# Patient Record
Sex: Female | Born: 1990
Health system: Southern US, Community
[De-identification: ages and names within clinical notes are randomized; demographics above are authoritative.]

## PROBLEM LIST (undated history)

## (undated) DIAGNOSIS — F329 Major depressive disorder, single episode, unspecified: Secondary | ICD-10-CM

## (undated) DIAGNOSIS — F32A Depression, unspecified: Secondary | ICD-10-CM

## (undated) HISTORY — DX: Depression, unspecified: F32.A

## (undated) HISTORY — PX: TONSILLECTOMY AND ADENOIDECTOMY: SUR1326

---
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
## 2017-04-30 ENCOUNTER — Ambulatory Visit (INDEPENDENT_AMBULATORY_CARE_PROVIDER_SITE_OTHER): Payer: 59 | Admitting: Psychiatry

## 2017-04-30 ENCOUNTER — Encounter (HOSPITAL_COMMUNITY): Payer: Self-pay | Admitting: Psychiatry

## 2017-04-30 VITALS — BP 122/70 | HR 124 | Ht 64.5 in | Wt 137.0 lb

## 2017-04-30 DIAGNOSIS — Z818 Family history of other mental and behavioral disorders: Secondary | ICD-10-CM

## 2017-04-30 DIAGNOSIS — Z915 Personal history of self-harm: Secondary | ICD-10-CM | POA: Diagnosis not present

## 2017-04-30 DIAGNOSIS — F339 Major depressive disorder, recurrent, unspecified: Secondary | ICD-10-CM | POA: Insufficient documentation

## 2017-04-30 DIAGNOSIS — Z810 Family history of intellectual disabilities: Secondary | ICD-10-CM

## 2017-04-30 MED ORDER — SERTRALINE HCL 50 MG PO TABS: ORAL_TABLET | ORAL | 1 refills | Status: DC

## 2017-04-30 NOTE — Patient Instructions (Signed)
1. Start sertraline 25 mg at night for one week, then 50 mg at night  2. Return to clinic in one month for 30 mins [facility] Address: [address], [place], [place] [address]  Phone: [phone]

## 2017-06-22 ENCOUNTER — Telehealth (HOSPITAL_COMMUNITY): Payer: Self-pay | Admitting: Psychiatry

## 2017-06-22 MED ORDER — SERTRALINE HCL 50 MG PO TABS: 50.0000 mg | ORAL_TABLET | Freq: Every day | ORAL | 0 refills | Status: DC

## 2017-06-22 NOTE — Telephone Encounter (Signed)
Received request for sertraline. Ordered for a month. Please contact the patient to make follow up appointment.

## 2017-06-22 NOTE — Telephone Encounter (Signed)
Mailbox is full & unable to accept any new messages @ this time

## 2017-08-11 NOTE — Progress Notes (Signed)
BH MD/PA/NP OP Progress Note  08/16/2017 9:32 AM Jennifer Parker  MRN:  [id number]  Chief Complaint:  Chief Complaint    Follow-up; Anxiety; Depression     HPI:  The patient presents for follow-up appointment for depression. She states that she has been doing very well since the last visit. She noticed that she has not had any crying spells since the last visit.  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 She reports good relationship with her fiancee. She is planning for wedding and things are going well. She still chews her tongue even when she feels relaxed. There are some scars from it. She used to pick her hair, skin growing up. She has fair sleep. She feels fatigue and has low motivation at times. She has good concentration. She denies SI. She had panic attack when she came back from home. She feels tense around her neck and has started doing massage every month.   Wt Readings from Last 3 Encounters:  08/16/17 136 lb (61.7 kg)  04/30/17 137 lb (62.1 kg)    Visit Diagnosis:    ICD-10-CM   1. Major depressive disorder, recurrent episode with seasonal pattern (HCC) F33.9     Past Psychiatric History: Please see initial evaluation for full details. I have reviewed the history. No updates at this time.     Past Medical History: No past medical history on file.  Past Surgical History:  Procedure Laterality Date  . TONSILLECTOMY AND ADENOIDECTOMY      Family Psychiatric History: Please see initial evaluation for full details. I have reviewed the history. No updates at this time.     Family History: No family history on file.  Social History:  Social History   Socioeconomic History  . Marital status: Single    Spouse name: Not on file  . Number of children: Not on file  . Years of  education: Not on file  . Highest education level: Not on file  Occupational History  . Not on file  Social Needs  . Financial resource strain: Not on file  . Food insecurity:    Worry: Not on file    Inability: Not on file  . Transportation needs:    Medical: Not on file    Non-medical: Not on file  Tobacco Use  . Smoking status: Never Smoker  . Smokeless tobacco: Never Used  Substance and Sexual Activity  . Alcohol use: Yes    Alcohol/week: 1.2 - 1.8 oz    Types: 2 - 3 Cans of beer per week  . Drug use: No  . Sexual activity: Yes    Birth control/protection: Condom  Lifestyle  . Physical activity:    Days per week: Not on file    Minutes per session: Not on file  . Stress: Not on file  Relationships  . Social connections:    Talks on phone: Not on file    Gets together: Not on file    Attends religious service: Not on file    Active member of club or organization: Not on file    Attends meetings of clubs or organizations: Not on file    Relationship status: Not on file  Other Topics Concern  . Not on file  Social History Narrative  . Not  on file    Allergies: No Known Allergies  Metabolic Disorder Labs: No results found for: HGBA1C, MPG No results found for: PROLACTIN No results found for: CHOL, TRIG, HDL, CHOLHDL, VLDL, LDLCALC No results found for: TSH  Therapeutic Level Labs: No results found for: LITHIUM No results found for: VALPROATE No components found for:  CBMZ  Current Medications: Current Outpatient Medications  Medication Sig Dispense Refill  . aspirin-acetaminophen-caffeine (EXCEDRIN MIGRAINE) 250-250-65 MG tablet Take by mouth as needed for headache.    . Cannabidiol 100 MG/ML SOLN Take by mouth.    . loratadine (CLARITIN) 10 MG tablet Take 10 mg by mouth daily.    . sertraline (ZOLOFT) 100 MG tablet Take 1 tablet (100 mg total) by mouth daily. 90 tablet 0   No current facility-administered medications for this visit.       Musculoskeletal: Strength & Muscle Tone: within normal limits Gait & Station: normal Patient leans: N/A  Psychiatric Specialty Exam: Review of Systems  Psychiatric/Behavioral: Negative for depression, hallucinations, memory loss, substance abuse and suicidal ideas. The patient is nervous/anxious. The patient does not have insomnia.   All other systems reviewed and are negative.   Blood pressure 114/80, pulse (!) 103, height 5' 4.5" (1.638 m), weight 136 lb (61.7 kg), SpO2 100 %.Body mass index is 22.98 kg/m.  General Appearance: Fairly Groomed  Eye Contact:  Good  Speech:  Clear and Coherent  Volume:  Normal  Mood:  "better"  Affect:  Appropriate and Congruent  Thought Process:  Coherent  Orientation:  Full (Time, Place, and Person)  Thought Content: Logical   Suicidal Thoughts:  No  Homicidal Thoughts:  No  Memory:  Immediate;   Good  Judgement:  Good  Insight:  Good  Psychomotor Activity:  Normal  Concentration:  Concentration: Good and Attention Span: Good  Recall:  Good  Fund of Knowledge: Good  Language: Good  Akathisia:  No  Handed:  Right  AIMS (if indicated): not done  Assets:  Communication Skills Desire for Improvement  ADL's:  Intact  Cognition: WNL  Sleep:  Good   Screenings:   Assessment and Plan:  Jennifer Parker is a 27 y.o. year old female with a history of anxiety, who presents for follow up appointment for Major depressive disorder, recurrent episode with seasonal pattern (HCC)  # MDD, moderate with seasonal pattern Patient reports significant improvement in neurovegetative symptoms and irritability.  Will uptitrate sertraline to target neurovegetative symptoms.  Discussed potential GI side effect, sexual dysfunction and increased SI in younger population.   # r/o OCD Patient reports constant chewing of her tongue. Will see if uptitration of sertraline helps her compulsion like symptoms. Also discussed "chew time' for behavioral  modification.   Plan 1. Increase sertraline 100 mg daily  2. Return to clinic in three months for 15 mins  3. Check TSH (thyroid function) with your primary care doctor  The patient demonstrates the following risk factors for suicide: Chronic risk factors for suicide include: psychiatric disorder of depression. Acute risk factors for suicide include: N/A. Protective factors for this patient include: responsibility to others (children, family), coping skills and hope for the future. Considering these factors, the overall suicide risk at this point appears to be low. Patient is appropriate for outpatient follow up.  The duration of this appointment visit was 30 minutes of face-to-face time with the patient.  Greater than 50% of this time was spent in counseling, explanation of  diagnosis, planning of further management, and  coordination of care.  Neysa Hottereina Quency Tober, MD 08/16/2017, 9:32 AM

## 2017-08-16 ENCOUNTER — Ambulatory Visit (INDEPENDENT_AMBULATORY_CARE_PROVIDER_SITE_OTHER): Payer: 59 | Admitting: Psychiatry

## 2017-08-16 ENCOUNTER — Encounter (HOSPITAL_COMMUNITY): Payer: Self-pay | Admitting: Psychiatry

## 2017-08-16 VITALS — BP 114/80 | HR 103 | Ht 64.5 in | Wt 136.0 lb

## 2017-08-16 DIAGNOSIS — F339 Major depressive disorder, recurrent, unspecified: Secondary | ICD-10-CM

## 2017-08-16 MED ORDER — SERTRALINE HCL 100 MG PO TABS: 100.0000 mg | ORAL_TABLET | Freq: Every day | ORAL | 0 refills | Status: DC

## 2017-08-16 NOTE — Patient Instructions (Signed)
1. Increase sertraline 100 mg daily  2. Return to clinic in three months for 15 mins  3. Check TSH (thryoid function) with your primary care doctor

## 2017-11-07 ENCOUNTER — Other Ambulatory Visit (HOSPITAL_COMMUNITY): Payer: Self-pay | Admitting: Psychiatry

## 2017-11-07 MED ORDER — SERTRALINE HCL 100 MG PO TABS: 100.0000 mg | ORAL_TABLET | Freq: Every day | ORAL | 0 refills | Status: DC

## 2017-11-10 NOTE — Progress Notes (Signed)
BH MD/PA/NP OP Progress Note  11/16/2017 8:25 AM Jennifer Parker  MRN:  [id number]  Chief Complaint:  Chief Complaint    Follow-up; Depression     HPI:  Patient presents for follow-up appointment for depression.  She states that she has not noticed significant change in her mood.  She becomes irritable and menstrual cycle, although she has been able to handle it.  Her sister will move out.  She reports great relationship with her fianc and has good communication with each other. She enjoys her job. She is concerned about her weight gain, although she has been active as before.  She denies insomnia.  She reports occasional fatigue.  She denies feeling depressed.  She has good concentration.  She denies SI.  She denies anxiety or panic attacks.    Wt Readings from Last 3 Encounters:  11/16/17 147 lb (66.7 kg)  08/16/17 136 lb (61.7 kg)  04/30/17 137 lb (62.1 kg)    Visit Diagnosis:    ICD-10-CM   1. Major depressive disorder, recurrent episode with seasonal pattern (HCC) F33.9     Past Psychiatric History: Please see initial evaluation for full details. I have reviewed the history. No updates at this time.     Past Medical History: No past medical history on file.  Past Surgical History:  Procedure Laterality Date  . TONSILLECTOMY AND ADENOIDECTOMY      Family Psychiatric History: Please see initial evaluation for full details. I have reviewed the history. No updates at this time.     Family History: No family history on file.  Social History:  Social History   Socioeconomic History  . Marital status: Single    Spouse name: Not on file  . Number of children: Not on file  . Years of education: Not on file  . Highest education level: Not on file  Occupational History  . Not on file  Social Needs  . Financial resource strain: Not on file  . Food insecurity:    Worry: Not on file    Inability: Not on file  . Transportation needs:    Medical: Not on file   Non-medical: Not on file  Tobacco Use  . Smoking status: Never Smoker  . Smokeless tobacco: Never Used  Substance and Sexual Activity  . Alcohol use: Yes    Alcohol/week: 2.0 - 3.0 standard drinks    Types: 2 - 3 Cans of beer per week  . Drug use: No  . Sexual activity: Yes    Birth control/protection: Condom  Lifestyle  . Physical activity:    Days per week: Not on file    Minutes per session: Not on file  . Stress: Not on file  Relationships  . Social connections:    Talks on phone: Not on file    Gets together: Not on file    Attends religious service: Not on file    Active member of club or organization: Not on file    Attends meetings of clubs or organizations: Not on file    Relationship status: Not on file  Other Topics Concern  . Not on file  Social History Narrative  . Not on file    Allergies: No Known Allergies  Metabolic Disorder Labs: No results found for: HGBA1C, MPG No results found for: PROLACTIN No results found for: CHOL, TRIG, HDL, CHOLHDL, VLDL, LDLCALC No results found for: TSH  Therapeutic Level Labs: No results found for: LITHIUM No results found for: VALPROATE No components found  for:  CBMZ  Current Medications: Current Outpatient Medications  Medication Sig Dispense Refill  . aspirin-acetaminophen-caffeine (EXCEDRIN MIGRAINE) 250-250-65 MG tablet Take by mouth as needed for headache.    . Cannabidiol 100 MG/ML SOLN Take by mouth.    . loratadine (CLARITIN) 10 MG tablet Take 10 mg by mouth daily.    . sertraline (ZOLOFT) 100 MG tablet Take 1 tablet (100 mg total) by mouth daily. 90 tablet 0   No current facility-administered medications for this visit.      Musculoskeletal: Strength & Muscle Tone: within normal limits Gait & Station: normal Patient leans: N/A  Psychiatric Specialty Exam: Review of Systems  Psychiatric/Behavioral: Negative for depression, hallucinations, memory loss, substance abuse and suicidal ideas. The patient  is not nervous/anxious and does not have insomnia.   All other systems reviewed and are negative.   Blood pressure 111/75, pulse 79, height 5' 4.5" (1.638 m), weight 147 lb (66.7 kg), SpO2 98 %.Body mass index is 24.84 kg/m.  General Appearance: Fairly Groomed  Eye Contact:  Good  Speech:  Clear and Coherent  Volume:  Normal  Mood:  "good"  Affect:  Appropriate, Congruent and Full Range  Thought Process:  Coherent  Orientation:  Full (Time, Place, and Person)  Thought Content: Logical   Suicidal Thoughts:  No  Homicidal Thoughts:  No  Memory:  Immediate;   Good  Judgement:  Good  Insight:  Good  Psychomotor Activity:  Normal  Concentration:  Concentration: Good and Attention Span: Good  Recall:  Good  Fund of Knowledge: Good  Language: Good  Akathisia:  No  Handed:  Right  AIMS (if indicated): not done  Assets:  Communication Skills Desire for Improvement  ADL's:  Intact  Cognition: WNL  Sleep:  Good   Screenings:   Assessment and Plan:  Xitlali Kastens is a 27 y.o. year old female with a history of anxiety , who presents for follow up appointment for Major depressive disorder, recurrent episode with seasonal pattern (HCC)  # MDD, moderate with seasonal pattern Although patient denies significant change in her mood symptoms, she has been steadily active and denies significant mood symptoms.  Will continue sertraline to target depression.  Noted that she has had weight gain; will consider tapering down if she continues to have weight gain.  Discussed potential GI side effect, sexual dysfunction and increase in SI in younger population.   # r/o OCD There has been improvement in chewing of her tongue as her mood improves.  Will continue to monitor.   Plan 1. Continue sertraline 100 mg daily  2. Return to clinic in three months for 15 mins 3. Check TSH (thyroid function) with your primary care doctor  The patient demonstrates the following risk factors for suicide:  Chronic risk factors for suicide include:psychiatric disorder ofdepression. Acute risk factorsfor suicide include: N/A. Protective factorsfor this patient include: responsibility to others (children, family), coping skills and hope for the future. Considering these factors, the overall suicide risk at this point appears to below. Patientisappropriate for outpatient follow up.  Neysa Hotter, MD 11/16/2017, 8:25 AM

## 2017-11-16 ENCOUNTER — Encounter (HOSPITAL_COMMUNITY): Payer: Self-pay | Admitting: Psychiatry

## 2017-11-16 ENCOUNTER — Ambulatory Visit (INDEPENDENT_AMBULATORY_CARE_PROVIDER_SITE_OTHER): Payer: 59 | Admitting: Psychiatry

## 2017-11-16 VITALS — BP 111/75 | HR 79 | Ht 64.5 in | Wt 147.0 lb

## 2017-11-16 DIAGNOSIS — F339 Major depressive disorder, recurrent, unspecified: Secondary | ICD-10-CM

## 2017-11-16 MED ORDER — SERTRALINE HCL 100 MG PO TABS
100.0000 mg | ORAL_TABLET | Freq: Every day | ORAL | 0 refills | Status: DC
Start: 2017-11-16 — End: 2018-02-22

## 2017-11-16 NOTE — Patient Instructions (Signed)
1. Continue sertraline 100 mg daily  2. Return to clinic in three months for 15 mins 

## 2018-02-21 ENCOUNTER — Ambulatory Visit (HOSPITAL_COMMUNITY): Payer: 59 | Admitting: Psychiatry

## 2018-02-22 ENCOUNTER — Other Ambulatory Visit (HOSPITAL_COMMUNITY): Payer: Self-pay | Admitting: Psychiatry

## 2018-02-22 MED ORDER — SERTRALINE HCL 100 MG PO TABS: 100.0000 mg | ORAL_TABLET | Freq: Every day | ORAL | 0 refills | Status: DC

## 2018-05-30 ENCOUNTER — Other Ambulatory Visit: Payer: Self-pay

## 2018-05-30 ENCOUNTER — Encounter (HOSPITAL_COMMUNITY): Payer: Self-pay | Admitting: Psychiatry

## 2018-05-30 ENCOUNTER — Ambulatory Visit (INDEPENDENT_AMBULATORY_CARE_PROVIDER_SITE_OTHER): Payer: 59 | Admitting: Psychiatry

## 2018-05-30 DIAGNOSIS — F339 Major depressive disorder, recurrent, unspecified: Secondary | ICD-10-CM | POA: Diagnosis not present

## 2018-05-30 MED ORDER — SERTRALINE HCL 100 MG PO TABS: 100.0000 mg | ORAL_TABLET | Freq: Every day | ORAL | 0 refills | Status: DC

## 2018-05-30 NOTE — Progress Notes (Signed)
Virtual Visit via Video Note  I connected with Jennifer Parker on 05/30/18 at  9:15 AM EDT by a video enabled telemedicine application and verified that I am speaking with the correct person using two identifiers.   I discussed the limitations of evaluation and management by telemedicine and the availability of in person appointments. The patient expressed understanding and agreed to proceed.    I discussed the assessment and treatment plan with the patient. The patient was provided an opportunity to ask questions and all were answered. The patient agreed with the plan and demonstrated an understanding of the instructions.   The patient was advised to call back or seek an in-person evaluation if the symptoms worsen or if the condition fails to improve as anticipated.  I provided 25 minutes of non-face-to-face time during this encounter.   Neysa Hotter, MD    [facility] MD/PA/NP OP Progress Note  05/30/2018 9:48 AM Jennifer Parker  MRN:  [id number]  Chief Complaint:  Chief Complaint    Follow-up; Depression     HPI:  This is a follow-up visit for depression.  She states that she wishes to go back on higher dose of sertraline.  She has been on 50 mg for the past few months as she was concerned about weight gain.  She did not notice any difference in her weight even after she decreased the dose.  She notices that she has been feeling more anxious and irritable especially at her fianc.  She also reports that she had false accusation at work.  She had move her workplace from [employer] without good notice to her clients.  It was very hard time for the patient as she also had to reduce the visit, and it affected her income.  She is concerned about difference in way of communication in [place] compared to other states.  She is concerned that she may be more direct compared to  people here.  She and her fianc discussed about the situation, and they may end up moving in an year,  although they will stay at least for an year.  She has fair sleep.  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  She has fair concentration.  She denies SI.  She feels anxious and tense at times.  She had a few panic attacks when she received a false accusation.    Visit Diagnosis:    ICD-10-CM   1. Major depressive disorder, recurrent episode with seasonal pattern (HCC) F33.9     Past Psychiatric History: Please see initial evaluation for full details. I have reviewed the history. No updates at this time.     Past Medical History: No past medical history on file.  Past Surgical History:  Procedure Laterality Date  . TONSILLECTOMY AND ADENOIDECTOMY      Family Psychiatric History: Please see initial evaluation for full details. I have reviewed the history. No updates at this time.     Family History: No family history on file.  Social History:  Social History   Socioeconomic History  . Marital status: Single    Spouse name: Not on file  . Number of children: Not on file  . Years of education: Not on file  . Highest education level: Not on file  Occupational History  . Not on file  Social Needs  . Financial resource strain: Not on file  .  Food insecurity:    Worry: Not on file    Inability: Not on file  . Transportation needs:    Medical: Not on file    Non-medical: Not on file  Tobacco Use  . Smoking status: Never Smoker  . Smokeless tobacco: Never Used  Substance and Sexual Activity  . Alcohol use: Yes    Alcohol/week: 2.0 - 3.0 standard drinks    Types: 2 - 3 Cans of beer per week  . Drug use: No  . Sexual activity: Yes    Birth control/protection: Condom  Lifestyle  . Physical activity:    Days per week: Not on file    Minutes per session: Not on file  . Stress: Not on file  Relationships  . Social connections:    Talks on  phone: Not on file    Gets together: Not on file    Attends religious service: Not on file    Active member of club or organization: Not on file    Attends meetings of clubs or organizations: Not on file    Relationship status: Not on file  Other Topics Concern  . Not on file  Social History Narrative  . Not on file    Allergies: No Known Allergies  Metabolic Disorder Labs: No results found for: HGBA1C, MPG No results found for: PROLACTIN No results found for: CHOL, TRIG, HDL, CHOLHDL, VLDL, LDLCALC No results found for: TSH  Therapeutic Level Labs: No results found for: LITHIUM No results found for: VALPROATE No components found for:  CBMZ  Current Medications: Current Outpatient Medications  Medication Sig Dispense Refill  . aspirin-acetaminophen-caffeine (EXCEDRIN MIGRAINE) 250-250-65 MG tablet Take by mouth as needed for headache.    . Cannabidiol 100 MG/ML SOLN Take by mouth.    . loratadine (CLARITIN) 10 MG tablet Take 10 mg by mouth daily.    . sertraline (ZOLOFT) 100 MG tablet Take 1 tablet (100 mg total) by mouth daily. 90 tablet 0   No current facility-administered medications for this visit.      Musculoskeletal: Strength & Muscle Tone: N/A Gait & Station: N/A Patient leans: N/A  Psychiatric Specialty Exam: Review of Systems  Psychiatric/Behavioral: Positive for depression. Negative for hallucinations, memory loss, substance abuse and suicidal ideas. The patient is nervous/anxious and has insomnia.   All other systems reviewed and are negative.   There were no vitals taken for this visit.There is no height or weight on file to calculate BMI.  General Appearance: Fairly Groomed  Eye Contact:  Good  Speech:  Clear and Coherent  Volume:  Normal  Mood:  Anxious and Depressed  Affect:  Appropriate, Congruent, Tearful and reactive  Thought Process:  Coherent and Goal Directed  Orientation:  Full (Time, Place, and Person)  Thought Content: Logical    Suicidal Thoughts:  No  Homicidal Thoughts:  No  Memory:  Immediate;   Good  Judgement:  Good  Insight:  Good  Psychomotor Activity:  Normal  Concentration:  Concentration: Good and Attention Span: Good  Recall:  Good  Fund of Knowledge: Good  Language: Good  Akathisia:  No  Handed:  Right  AIMS (if indicated): not done  Assets:  Communication Skills Desire for Improvement  ADL's:  Intact  Cognition: WNL  Sleep:  Fair   Screenings:   Assessment and Plan:  Jennifer GashKatharyn Patterson is a 28 y.o. year old female with a history of anxiety, who presents for follow up appointment for Major depressive disorder, recurrent episode with  seasonal pattern (HCC)  # MDD, moderate with seasonal pattern Patient reports worsening in depressive symptoms after she tapered down sertraline.  There was also recent psychosocial stressors of being use of false accusation at work.  Will uptitrate sertraline to original dose to target depression.  Noted that although she was given choice of trying other antidepressant given her concern of weight gain, she prefers to stay on this medication.  Discussed potential GI side effect, sexual dysfunction and increasing SI in younger population.   Plan I have reviewed and updated plans as below 1. Continue sertraline 100 mg daily  2. Return to clinic in three months, transfer to [place] office due to change in her workplace 3. Check TSH (thyroid function) with your primary care doctor  The patient demonstrates the following risk factors for suicide: Chronic risk factors for suicide include:psychiatric disorder ofdepression. Acute risk factorsfor suicide include: N/A. Protective factorsfor this patient include: responsibility to others (children, family), coping skills and hope for the future. Considering these factors, the overall suicide risk at this point appears to below. Patientisappropriate for outpatient follow up.   Neysa Hotter, MD 05/30/2018, 9:48  AM

## 2018-05-30 NOTE — Patient Instructions (Signed)
1. Continue sertraline 100 mg daily  2. Return to clinic in three months, transfer to [facility] office

## 2018-08-21 ENCOUNTER — Other Ambulatory Visit (HOSPITAL_COMMUNITY): Payer: Self-pay | Admitting: Psychiatry

## 2018-08-21 MED ORDER — SERTRALINE HCL 100 MG PO TABS: 100.0000 mg | ORAL_TABLET | Freq: Every day | ORAL | 0 refills | Status: DC

## 2018-08-30 ENCOUNTER — Ambulatory Visit (HOSPITAL_COMMUNITY): Payer: 59 | Admitting: Psychiatry

## 2018-08-30 NOTE — Progress Notes (Signed)
Virtual Visit via Video Note  I connected with Jennifer Parker on 09/06/18 at  4:00 PM EDT by a video enabled telemedicine application and verified that I am speaking with the correct person using two identifiers.   I discussed the limitations of evaluation and management by telemedicine and the availability of in person appointments. The patient expressed understanding and agreed to proceed.    I discussed the assessment and treatment plan with the patient. The patient was provided an opportunity to ask questions and all were answered. The patient agreed with the plan and demonstrated an understanding of the instructions.   The patient was advised to call back or seek an in-person evaluation if the symptoms worsen or if the condition fails to improve as anticipated.  I provided 25 minutes of non-face-to-face time during this encounter.   Neysa Hottereina Mckenzee Beem, MD   [facility] MD/[facility] OP Progress Note  09/06/2018 4:52 PM Jennifer Parker  MRN:  [id number]  Chief Complaint:  Chief Complaint    Anxiety; Depression     HPI:  This is a follow-up appointment for depression.  She states that she has been doing well.  She got married since the last visit.  The wedding went well, although she was very nervous preparing for it.  Although she has not gotten back to full-time yet, she has been developing full case load. 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 She states that she had an episode of sleeping 2 hours of sleep each night, feeling energized, having many ideas about online project for clients, which lasted for three nights in April. Her sister was also concerned of this episode. She had another episode of decreased need for sleep for a day, a few weeks ago. She is concerned about this as this is very unusual, stating that she always sleeps well. She states that both episode occurred when she watches news of recent protest. She wonders whether this is secondary to  bipolar disorder, referring to her mother, who may have bipolar disorder (not formally diagnosed). She sleeps well on other days. She denies feeling depressed.  She has good motivation and energy.  She has good concentration.  She denies SI.  She feels less anxious.  She denies panic attacks.  She denies any increased goal-directed behavior.  She denies irritability. She drinks a few mixed drinks, a few times per week. She denies drug use.   Her sister presents for video visit for collaterals with patient consent. Glenda ChromanKatharyn was not sleeping well for a few days, and was unable to focus while she tries to do many thing. There was a time she bought items in shopping, which they already recently bought. Her sister is not aware of any similar episodes before this time.    Visit Diagnosis:    ICD-10-CM   1. Major depressive disorder, recurrent episode with seasonal pattern (HCC)  F33.9     Past Psychiatric History: Please see initial evaluation for full details. I have reviewed the history. No updates at this time.     Past Medical History: No past medical history on file.  Past Surgical History:  Procedure Laterality Date  . TONSILLECTOMY AND ADENOIDECTOMY      Family Psychiatric History: Please see initial evaluation for full details. I have reviewed the history. No updates at this time.     Family History:  Family History  Problem Relation Age of Onset  . Alcohol abuse Mother   . Depression Mother   .  Depression Sister   . Anxiety disorder Sister   . Depression Maternal Grandmother     Social History:  Social History   Socioeconomic History  . Marital status: Single    Spouse name: Not on file  . Number of children: Not on file  . Years of education: Not on file  . Highest education level: Not on file  Occupational History  . Not on file  Social Needs  . Financial resource strain: Not on file  . Food insecurity    Worry: Not on file    Inability: Not on file  .  Transportation needs    Medical: Not on file    Non-medical: Not on file  Tobacco Use  . Smoking status: Never Smoker  . Smokeless tobacco: Never Used  Substance and Sexual Activity  . Alcohol use: Yes    Alcohol/week: 2.0 - 3.0 standard drinks    Types: 2 - 3 Cans of beer per week  . Drug use: No  . Sexual activity: Yes    Birth control/protection: Condom  Lifestyle  . Physical activity    Days per week: Not on file    Minutes per session: Not on file  . Stress: Not on file  Relationships  . Social Musicianconnections    Talks on phone: Not on file    Gets together: Not on file    Attends religious service: Not on file    Active member of club or organization: Not on file    Attends meetings of clubs or organizations: Not on file    Relationship status: Not on file  Other Topics Concern  . Not on file  Social History Narrative  . Not on file    Allergies: No Known Allergies  Metabolic Disorder Labs: No results found for: HGBA1C, MPG No results found for: PROLACTIN No results found for: CHOL, TRIG, HDL, CHOLHDL, VLDL, LDLCALC No results found for: TSH  Therapeutic Level Labs: No results found for: LITHIUM No results found for: VALPROATE No components found for:  CBMZ  Current Medications: Current Outpatient Medications  Medication Sig Dispense Refill  . aspirin-acetaminophen-caffeine (EXCEDRIN MIGRAINE) 250-250-65 MG tablet Take by mouth as needed for headache.    . Cannabidiol 100 MG/ML SOLN Take by mouth.    . loratadine (CLARITIN) 10 MG tablet Take 10 mg by mouth daily.    . sertraline (ZOLOFT) 100 MG tablet Take 1 tablet (100 mg total) by mouth daily. 90 tablet 0   No current facility-administered medications for this visit.      Musculoskeletal: Strength & Muscle Tone: N/A Gait & Station: N/A Patient leans: N/A  Psychiatric Specialty Exam: Review of Systems  Psychiatric/Behavioral: Negative for depression, hallucinations, memory loss, substance abuse and  suicidal ideas. The patient is nervous/anxious. The patient does not have insomnia.   All other systems reviewed and are negative.   There were no vitals taken for this visit.There is no height or weight on file to calculate BMI.  General Appearance: Fairly Groomed  Eye Contact:  Good  Speech:  Clear and Coherent  Volume:  Normal  Mood:  "good"  Affect:  Appropriate, Congruent and Full Range  Thought Process:  Coherent  Orientation:  Full (Time, Place, and Person)  Thought Content: Logical   Suicidal Thoughts:  No  Homicidal Thoughts:  No  Memory:  Immediate;   Good  Judgement:  Good  Insight:  Good  Psychomotor Activity:  Normal  Concentration:  Concentration: Good and Attention Span: Good  Recall:  Good  Fund of Knowledge: Good  Language: Good  Akathisia:  No  Handed:  Right  AIMS (if indicated): not done  Assets:  Communication Skills Desire for Improvement  ADL's:  Intact  Cognition: WNL  Sleep:  Good   Screenings:   Assessment and Plan:  Daana Petrasek is a 28 y.o. year old female with a history of anxiety, who presents for follow up appointment for depression.    # MDD, mild with seasonal pattern R/o mixed features There has been overall improvement in depressive symptoms since her last visit.  Psychosocial stressors includes also accusation at work. Although the patient and her sister reports subthreshold hypomanic symptoms of a few days of decreased need for sleep with euphoria, it is difficult to discern whether it is related to any underling bipolar disorder. Provided psycho education of bipolar disorder. Will continue sertraline at this time for depression.   Plan I have reviewed and updated plans as below 1. Continue sertraline 100 mg daily  2. Next appointment: 9/15 at 9:40 for 20 mins, video 3.Check TSH (thyroid function) with your primary care doctor  The patient demonstrates the following risk factors for suicide: Chronic risk factors for  suicide include:psychiatric disorder ofdepression. Acute risk factorsfor suicide include: N/A. Protective factorsfor this patient include: responsibility to others (children, family), coping skills and hope for the future. Considering these factors, the overall suicide risk at this point appears to below. Patientisappropriate for outpatient follow up.  The duration of this appointment visit was 25 minutes of face-to-face time with the patient.  Greater than 50% of this time was spent in counseling, explanation of  diagnosis, planning of further management, and coordination of care.  Norman Clay, MD 09/06/2018, 4:52 PM

## 2018-09-06 ENCOUNTER — Other Ambulatory Visit: Payer: Self-pay

## 2018-09-06 ENCOUNTER — Encounter (HOSPITAL_COMMUNITY): Payer: Self-pay | Admitting: Psychiatry

## 2018-09-06 ENCOUNTER — Ambulatory Visit (INDEPENDENT_AMBULATORY_CARE_PROVIDER_SITE_OTHER): Payer: 59 | Admitting: Psychiatry

## 2018-09-06 DIAGNOSIS — F339 Major depressive disorder, recurrent, unspecified: Secondary | ICD-10-CM

## 2018-09-06 NOTE — Patient Instructions (Signed)
1. Continue sertraline 100 mg daily  2. Next appointment: 9/15 at 9:40

## 2018-10-27 NOTE — Progress Notes (Signed)
Virtual Visit via Video Note  I connected with Jennifer Parker on 10/31/18 at  9:40 AM EDT by a video enabled telemedicine application and verified that I am speaking with the correct person using two identifiers.   I discussed the limitations of evaluation and management by telemedicine and the availability of in person appointments. The patient expressed understanding and agreed to proceed.     I discussed the assessment and treatment plan with the patient. The patient was provided an opportunity to ask questions and all were answered. The patient agreed with the plan and demonstrated an understanding of the instructions.   The patient was advised to call back or seek an in-person evaluation if the symptoms worsen or if the condition fails to improve as anticipated.  I provided 15 minutes of non-face-to-face time during this encounter.   Neysa Hotter, MD    [facility] MD/PA/NP OP Progress Note  10/31/2018 10:06 AM Jennifer Parker  MRN:  [id number]  Chief Complaint:  Chief Complaint    Anxiety; Depression; Follow-up     HPI:  This is a follow-up appointment for depression.  She states that there was "manic" episode a few days ago. She could not find things in a clutter, and she ended up cleaning the entire house at night. She cried after her husband sheared his concern that how she was "geared up." It lasted for about 45 mins. She notices that she tends to feel dragged when she misses to take sertraline.  Although she feels lonely at times as she is unable to meet with her husband due to his work, she enjoys time with him when they are together ("safety blanket.") She feels anxious, tense at times as there are clutters in the house after they had weeding, relocation and her sister moved in. She had a panic attack when she had negative feedback from her supervisor in front of other co-workers.  Although she believes she has been gaining weight since pandemic, the major is broken and she  is unsure of her weight.  She agreed to have annual physical.  She sleeps well except there were a few days she slept only for a few hours with mild decreased need for sleep.  She has fair concentration.  She denies anhedonia.  She denies SI.  She denies irritability.  She denies impulsive behavior.    Visit Diagnosis:    ICD-10-CM   1. Major depressive disorder, recurrent episode with seasonal pattern (HCC)  F33.9     Past Psychiatric History: Please see initial evaluation for full details. I have reviewed the history. No updates at this time.     Past Medical History: No past medical history on file.  Past Surgical History:  Procedure Laterality Date  . TONSILLECTOMY AND ADENOIDECTOMY      Family Psychiatric History: Please see initial evaluation for full details. I have reviewed the history. No updates at this time.     Family History:  Family History  Problem Relation Age of Onset  . Alcohol abuse Mother   . Depression Mother   . Depression Sister   . Anxiety disorder Sister   . Depression Maternal Grandmother     Social History:  Social History   Socioeconomic History  . Marital status: Single    Spouse name: Not on file  . Number of children: Not on file  . Years of education: Not on file  . Highest education level: Not on file  Occupational History  . Not on file  Social Needs  . Financial resource strain: Not on file  . Food insecurity    Worry: Not on file    Inability: Not on file  . Transportation needs    Medical: Not on file    Non-medical: Not on file  Tobacco Use  . Smoking status: Never Smoker  . Smokeless tobacco: Never Used  Substance and Sexual Activity  . Alcohol use: Yes    Alcohol/week: 2.0 - 3.0 standard drinks    Types: 2 - 3 Cans of beer per week  . Drug use: No  . Sexual activity: Yes    Birth control/protection: Condom  Lifestyle  . Physical activity    Days per week: Not on file    Minutes per session: Not on file  . Stress:  Not on file  Relationships  . Social Herbalist on phone: Not on file    Gets together: Not on file    Attends religious service: Not on file    Active member of club or organization: Not on file    Attends meetings of clubs or organizations: Not on file    Relationship status: Not on file  Other Topics Concern  . Not on file  Social History Narrative  . Not on file    Allergies: No Known Allergies  Metabolic Disorder Labs: No results found for: HGBA1C, MPG No results found for: PROLACTIN No results found for: CHOL, TRIG, HDL, CHOLHDL, VLDL, LDLCALC No results found for: TSH  Therapeutic Level Labs: No results found for: LITHIUM No results found for: VALPROATE No components found for:  CBMZ  Current Medications: Current Outpatient Medications  Medication Sig Dispense Refill  . aspirin-acetaminophen-caffeine (EXCEDRIN MIGRAINE) 250-250-65 MG tablet Take by mouth as needed for headache.    . Cannabidiol 100 MG/ML SOLN Take by mouth.    . loratadine (CLARITIN) 10 MG tablet Take 10 mg by mouth daily.    Derrill Memo ON 11/29/2018] sertraline (ZOLOFT) 100 MG tablet Take 1 tablet (100 mg total) by mouth daily. 90 tablet 0   No current facility-administered medications for this visit.      Musculoskeletal: Strength & Muscle Tone: N/A Gait & Station: N/A Patient leans: N/A  Psychiatric Specialty Exam: Review of Systems  Psychiatric/Behavioral: Positive for depression. Negative for hallucinations, memory loss, substance abuse and suicidal ideas. The patient is nervous/anxious and has insomnia.   All other systems reviewed and are negative.   There were no vitals taken for this visit.There is no height or weight on file to calculate BMI.  General Appearance: Fairly Groomed  Eye Contact:  Good  Speech:  Clear and Coherent  Volume:  Normal  Mood:  "good"  Affect:  Appropriate, Congruent and reactive  Thought Process:  Coherent  Orientation:  Full (Time, Place, and  Person)  Thought Content: Logical   Suicidal Thoughts:  No  Homicidal Thoughts:  No  Memory:  Immediate;   Good  Judgement:  Good  Insight:  Fair  Psychomotor Activity:  Normal  Concentration:  Concentration: Good and Attention Span: Good  Recall:  Good  Fund of Knowledge: Good  Language: Good  Akathisia:  No  Handed:  Right  AIMS (if indicated): not done  Assets:  Communication Skills Desire for Improvement  ADL's:  Intact  Cognition: WNL  Sleep:  Fair   Screenings:   Assessment and Plan:  Jennifer Payes is a 28 y.o. year old female with a history of anxiety , who presents for  follow up appointment for Major depressive disorder, recurrent episode with seasonal pattern [facility](HCC)  # MDD, mild with seasonal pattern R/o mixed features She continues to report occasional anxiety and feeling of sadness due to loneliness.  Psychosocial stressors includes recent relocation, prior accusation at work.  Will continue sertraline to target depression. Noted that although the patient reports occasional episode of subthreshold hypomanic symptoms of mild euphoria and decreased need for sleep around menstrual cycle, it usually subsides within an hour. Will continue to monitor.   Plan I have reviewed and updated plans as below 1. Continue sertraline 100 mg daily  2. Next appointment: 11/9 at 11:40 for 20 mins, video 3.Check TSH (thyroid function) with your primary care doctor  The patient demonstrates the following risk factors for suicide: Chronic risk factors for suicide include:psychiatric disorder ofdepression. Acute risk factorsfor suicide include: N/A. Protective factorsfor this patient include: responsibility to others (children, family), coping skills and hope for the future. Considering these factors, the overall suicide risk at this point appears to below. Patientisappropriate for outpatient follow up.  Neysa Hottereina Traniyah Hallett, MD 10/31/2018, 10:06 AM

## 2018-10-31 ENCOUNTER — Encounter (HOSPITAL_COMMUNITY): Payer: Self-pay | Admitting: Psychiatry

## 2018-10-31 ENCOUNTER — Other Ambulatory Visit: Payer: Self-pay

## 2018-10-31 ENCOUNTER — Ambulatory Visit (INDEPENDENT_AMBULATORY_CARE_PROVIDER_SITE_OTHER): Payer: 59 | Admitting: Psychiatry

## 2018-10-31 DIAGNOSIS — F339 Major depressive disorder, recurrent, unspecified: Secondary | ICD-10-CM

## 2018-10-31 MED ORDER — SERTRALINE HCL 100 MG PO TABS: 100.0000 mg | ORAL_TABLET | Freq: Every day | ORAL | 0 refills | Status: DC

## 2018-10-31 NOTE — Patient Instructions (Signed)
1. Continue sertraline 100 mg daily  2. Next appointment: 11/9 at 11:40

## 2018-12-18 NOTE — Progress Notes (Addendum)
Virtual Visit via Video Note  I connected with Jennifer Parker on 12/25/18 at 11:40 AM EST by a video enabled telemedicine application and verified that I am speaking with the correct person using two identifiers.   I discussed the limitations of evaluation and management by telemedicine and the availability of in person appointments. The patient expressed understanding and agreed to proceed.     I discussed the assessment and treatment plan with the patient. The patient was provided an opportunity to ask questions and all were answered. The patient agreed with the plan and demonstrated an understanding of the instructions.   The patient was advised to call back or seek an in-person evaluation if the symptoms worsen or if the condition fails to improve as anticipated.  I provided 15 minutes of non-face-to-face time during this encounter.   Neysa Hottereina Kimmy Parish, MD    [facility] OP Progress Note  12/25/2018 12:12 PM Jennifer Parker  MRN:  [id number]  Chief Complaint:  Chief Complaint    Depression; Follow-up     HPI:  This is a follow-up appointment for depression.  She was driving when she signed in to video.  When she was instructed to pull over her car for safety, she states that she feels upset as she will not be at home on time (although she agrees to pull over her car.) She also states that she did not expect this appointment so soon given there has been no change, although she admits that she made this appointment.   She states that there was a few nights she felt excited and was awake until 5 am. She notices a pattern of feeling this way, and has been managing better so that she can sleeps at night. She also talks with her husband when she thought she might do something impulsive. She tends to feel excited about politics and she had an argument with her brother the other day. She handles works well. Although she was upset when her manager was disrespectful, the condition got  better after she talked with her supervisor. She started to ask her client for feedback, which has been going well. Her sister moved out and she reports there has been change in dynamic at home. She reports good relationship with her husband. She sleeps fair except a few nights she has decreased need for sleep. She occasionally feels down and anxious around menstrual cycle. She has good energy and motivation.  She has good concentration.  She denies SI.  She denies anxiety or panic attacks.    Visit Diagnosis:    ICD-10-CM   1. Major depressive disorder, recurrent episode with seasonal pattern (HCC)  F33.9     Past Psychiatric History: Please see initial evaluation for full details. I have reviewed the history. No updates at this time.     Past Medical History: No past medical history on file.  Past Surgical History:  Procedure Laterality Date  . TONSILLECTOMY AND ADENOIDECTOMY      Family Psychiatric History: Please see initial evaluation for full details. I have reviewed the history. No updates at this time.     Family History:  Family History  Problem Relation Age of Onset  . Alcohol abuse Mother   . Depression Mother   . Depression Sister   . Anxiety disorder Sister   . Depression Maternal Grandmother     Social History:  Social History   Socioeconomic History  . Marital status: Single    Spouse name: Not on file  .  Number of children: Not on file  . Years of education: Not on file  . Highest education level: Not on file  Occupational History  . Not on file  Social Needs  . Financial resource strain: Not on file  . Food insecurity    Worry: Not on file    Inability: Not on file  . Transportation needs    Medical: Not on file    Non-medical: Not on file  Tobacco Use  . Smoking status: Never Smoker  . Smokeless tobacco: Never Used  Substance and Sexual Activity  . Alcohol use: Yes    Alcohol/week: 2.0 - 3.0 standard drinks    Types: 2 - 3 Cans of beer per  week  . Drug use: No  . Sexual activity: Yes    Birth control/protection: Condom  Lifestyle  . Physical activity    Days per week: Not on file    Minutes per session: Not on file  . Stress: Not on file  Relationships  . Social Herbalist on phone: Not on file    Gets together: Not on file    Attends religious service: Not on file    Active member of club or organization: Not on file    Attends meetings of clubs or organizations: Not on file    Relationship status: Not on file  Other Topics Concern  . Not on file  Social History Narrative  . Not on file    Allergies: No Known Allergies  Metabolic Disorder Labs: No results found for: HGBA1C, MPG No results found for: PROLACTIN No results found for: CHOL, TRIG, HDL, CHOLHDL, VLDL, LDLCALC No results found for: TSH  Therapeutic Level Labs: No results found for: LITHIUM No results found for: VALPROATE No components found for:  CBMZ  Current Medications: Current Outpatient Medications  Medication Sig Dispense Refill  . aspirin-acetaminophen-caffeine (EXCEDRIN MIGRAINE) 250-250-65 MG tablet Take by mouth as needed for headache.    . Cannabidiol 100 MG/ML SOLN Take by mouth.    . loratadine (CLARITIN) 10 MG tablet Take 10 mg by mouth daily.    Derrill Memo ON 02/27/2019] sertraline (ZOLOFT) 100 MG tablet Take 1 tablet (100 mg total) by mouth daily. 90 tablet 1   No current facility-administered medications for this visit.      Musculoskeletal: Strength & Muscle Tone: N/A Gait & Station: N/A Patient leans: N/A  Psychiatric Specialty Exam: Review of Systems  Psychiatric/Behavioral: Negative for depression, hallucinations, memory loss, substance abuse and suicidal ideas. The patient has insomnia. The patient is not nervous/anxious.   All other systems reviewed and are negative.   There were no vitals taken for this visit.There is no height or weight on file to calculate BMI.  General Appearance: Fairly Groomed   Eye Contact:  Good  Speech:  Clear and Coherent  Volume:  Normal  Mood:  "fine"  Affect:  Appropriate, Congruent and initially tense, later reactive  Thought Process:  Coherent  Orientation:  Full (Time, Place, and Person)  Thought Content: Logical   Suicidal Thoughts:  No  Homicidal Thoughts:  No  Memory:  Immediate;   Good  Judgement:  Good  Insight:  Fair  Psychomotor Activity:  Normal  Concentration:  Concentration: Good and Attention Span: Good  Recall:  Good  Fund of Knowledge: Good  Language: Good  Akathisia:  No  Handed:  Right  AIMS (if indicated): not done  Assets:  Communication Skills Desire for Improvement  ADL's:  Intact  Cognition: WNL  Sleep:  Fair   Screenings:   Assessment and Plan:  Alee Gressman is a 28 y.o. year old female with a history of anxiety, who presents for follow up appointment for Major depressive disorder, recurrent episode with seasonal pattern (HCC)  # MDD, recurrent episode in partial remission # r/o mixed features Although she continues to report occasionally increased energy level, she denies any significant mood symptoms otherwise.  Psychosocial stressors includes prior accusation at work.  We will continue sertraline to target depression.  Noted that although patient reports occasional episodes of subthreshold hypomanic symptoms with mild euphoria and decreased need for sleep, it usually subsides within 1 day. Will continue to monitor. She prefers to have six months follow up instead of three months. She is advised to contact office for sooner appointment if any worsening in her mood symptoms.   Plan I have reviewed and updated plans as below 1. Continue sertraline 100 mg daily 2. Next appointment: 5/3 at 1 PM for 20 mins, video 3.Check TSH (thyroid function) with your primary care doctor  The patient demonstrates the following risk factors for suicide: Chronic risk factors for suicide include:psychiatric disorder  ofdepression. Acute risk factorsfor suicide include: N/A. Protective factorsfor this patient include: responsibility to others (children, family), coping skills and hope for the future. Considering these factors, the overall suicide risk at this point appears to below. Patientisappropriate for outpatient follow up.  Neysa Hotter, MD 12/25/2018, 12:12 PM

## 2018-12-25 ENCOUNTER — Other Ambulatory Visit: Payer: Self-pay

## 2018-12-25 ENCOUNTER — Ambulatory Visit (INDEPENDENT_AMBULATORY_CARE_PROVIDER_SITE_OTHER): Payer: 59 | Admitting: Psychiatry

## 2018-12-25 ENCOUNTER — Encounter (HOSPITAL_COMMUNITY): Payer: Self-pay | Admitting: Psychiatry

## 2018-12-25 DIAGNOSIS — F339 Major depressive disorder, recurrent, unspecified: Secondary | ICD-10-CM | POA: Diagnosis not present

## 2018-12-25 MED ORDER — SERTRALINE HCL 100 MG PO TABS: 100.0000 mg | ORAL_TABLET | Freq: Every day | ORAL | 1 refills | Status: DC

## 2019-04-23 ENCOUNTER — Ambulatory Visit (INDEPENDENT_AMBULATORY_CARE_PROVIDER_SITE_OTHER): Payer: Self-pay

## 2019-04-23 DIAGNOSIS — Z3A00 Weeks of gestation of pregnancy not specified: Secondary | ICD-10-CM

## 2019-04-23 DIAGNOSIS — Z3401 Encounter for supervision of normal first pregnancy, first trimester: Secondary | ICD-10-CM

## 2019-04-23 DIAGNOSIS — Z3480 Encounter for supervision of other normal pregnancy, unspecified trimester: Secondary | ICD-10-CM | POA: Insufficient documentation

## 2019-04-23 NOTE — Progress Notes (Signed)
I connected with  Cletus Gash on 04/23/19 by a video enabled telemedicine application and verified that I am speaking with the correct person using two identifiers.  OB History  Gravida Para Term Preterm AB Living  1            SAB TAB Ectopic Multiple Live Births               # Outcome Date GA Lbr Len/2nd Weight Sex Delivery Anes PTL Lv  1 Current            Assessment   Normal Pregnancy  Subjective   Pt reports getting the covid accine 03/29/19. She would like to come into the office for all visits.   Plan   F/u on 3/16.

## 2019-04-24 NOTE — Progress Notes (Signed)
Patient seen and assessed by nursing staff during this encounter. I have reviewed the chart and agree with the documentation and plan.  Catalina Antigua, MD 04/24/2019 6:44 PM

## 2019-04-30 ENCOUNTER — Other Ambulatory Visit: Payer: Self-pay

## 2019-04-30 ENCOUNTER — Encounter: Payer: Self-pay | Admitting: Advanced Practice Midwife

## 2019-04-30 MED ORDER — PROMETHAZINE HCL 25 MG PO TABS: 25.0000 mg | ORAL_TABLET | Freq: Four times a day (QID) | ORAL | 0 refills | Status: DC | PRN

## 2019-04-30 NOTE — Progress Notes (Signed)
Pt called and reports her new OB appt is scheduled for tomorrow, however she has been vomiting all night and this morning. I advised pt to go to the hospital for evaluation, pt declines to go at this time. Pt denies any pain, cramping, or bleeding. Pt reports she is urinating normally and no other signs of dehydration at this time. I advised pt I can send her Rx for phenergan per protocol and to watch for signs of dehydration. Advised pt to go to the hospital if she starts having pain, bleeding, or any signs of severe dehydration, pt voices understanding.

## 2019-05-01 ENCOUNTER — Other Ambulatory Visit: Payer: Self-pay

## 2019-05-01 ENCOUNTER — Encounter: Payer: Self-pay | Admitting: Obstetrics

## 2019-05-01 ENCOUNTER — Encounter: Payer: Self-pay | Admitting: Advanced Practice Midwife

## 2019-05-01 ENCOUNTER — Ambulatory Visit (INDEPENDENT_AMBULATORY_CARE_PROVIDER_SITE_OTHER): Payer: 59 | Admitting: Advanced Practice Midwife

## 2019-05-01 VITALS — BP 125/89 | HR 115 | Temp 98.7°F | Wt 158.7 lb

## 2019-05-01 DIAGNOSIS — Z113 Encounter for screening for infections with a predominantly sexual mode of transmission: Secondary | ICD-10-CM | POA: Diagnosis not present

## 2019-05-01 DIAGNOSIS — Z3401 Encounter for supervision of normal first pregnancy, first trimester: Secondary | ICD-10-CM

## 2019-05-01 DIAGNOSIS — O210 Mild hyperemesis gravidarum: Secondary | ICD-10-CM

## 2019-05-01 DIAGNOSIS — Z124 Encounter for screening for malignant neoplasm of cervix: Secondary | ICD-10-CM | POA: Diagnosis not present

## 2019-05-01 MED ORDER — METOCLOPRAMIDE HCL 10 MG PO TABS: 10.0000 mg | ORAL_TABLET | Freq: Three times a day (TID) | ORAL | 5 refills | Status: DC | PRN

## 2019-05-01 MED ORDER — ONDANSETRON 4 MG PO TBDP: 4.0000 mg | ORAL_TABLET | Freq: Three times a day (TID) | ORAL | 5 refills | Status: DC | PRN

## 2019-05-01 MED ORDER — PANTOPRAZOLE SODIUM 40 MG PO TBEC: 40.0000 mg | DELAYED_RELEASE_TABLET | Freq: Every day | ORAL | 5 refills | Status: DC

## 2019-05-01 NOTE — Progress Notes (Signed)
Subjective:   Jennifer Parker is a 29 y.o. G1P0 at [redacted]w[redacted]d by LMP being seen today for her first obstetrical visit.  Her obstetrical history is significant for none, G1 and has Major depressive disorder, recurrent episode with seasonal pattern (HCC) and Encounter for supervision of normal first pregnancy in first trimester on their problem list.. Patient does intend to breast feed. Pregnancy history fully reviewed.  Patient reports nausea and vomiting.  HISTORY: OB History  Gravida Para Term Preterm AB Living  1 0 0 0 0 0  SAB TAB Ectopic Multiple Live Births  0 0 0 0 0    # Outcome Date GA Lbr Len/2nd Weight Sex Delivery Anes PTL Lv  1 Current            Past Medical History:  Diagnosis Date  . Depression    Past Surgical History:  Procedure Laterality Date  . TONSILLECTOMY AND ADENOIDECTOMY     Family History  Problem Relation Age of Onset  . Alcohol abuse Mother   . Depression Mother   . Depression Sister   . Anxiety disorder Sister   . Depression Maternal Grandmother    Social History   Tobacco Use  . Smoking status: Never Smoker  . Smokeless tobacco: Never Used  Substance Use Topics  . Alcohol use: Yes    Alcohol/week: 2.0 - 3.0 standard drinks    Types: 2 - 3 Cans of beer per week  . Drug use: No   Allergies  Allergen Reactions  . Kale Itching   Current Outpatient Medications on File Prior to Visit  Medication Sig Dispense Refill  . promethazine (PHENERGAN) 25 MG tablet Take 1 tablet (25 mg total) by mouth every 6 (six) hours as needed for nausea or vomiting. 30 tablet 0  . aspirin-acetaminophen-caffeine (EXCEDRIN MIGRAINE) 250-250-65 MG tablet Take by mouth as needed for headache.    . Cannabidiol 100 MG/ML SOLN Take by mouth.    . loratadine (CLARITIN) 10 MG tablet Take 10 mg by mouth daily.    . sertraline (ZOLOFT) 100 MG tablet Take 1 tablet (100 mg total) by mouth daily. (Patient not taking: Reported on 04/23/2019) 90 tablet 1   No current  facility-administered medications on file prior to visit.     Indications for ASA therapy (per uptodate) One of the following: Previous pregnancy with preeclampsia, especially early onset and with an adverse outcome No Multifetal gestation No Chronic hypertension No Type 1 or 2 diabetes mellitus No Chronic kidney disease No Autoimmune disease (antiphospholipid syndrome, systemic lupus erythematosus) No   Two or more of the following: Nulliparity Yes Obesity (body mass index >30 kg/m2) No Family history of preeclampsia in mother or sister No Age ?35 years No Sociodemographic characteristics (African American race, low socioeconomic level) No Personal risk factors (eg, previous pregnancy with low birth weight or small for gestational age infant, previous adverse pregnancy outcome [eg, stillbirth], interval >10 years between pregnancies) No   Indications for early 1 hour GTT (per uptodate)  BMI >25 (>23 in Asian women) AND one of the following  Gestational diabetes mellitus in a previous pregnancy No Glycated hemoglobin ?5.7 percent (39 mmol/mol), impaired glucose tolerance, or impaired fasting glucose on previous testing No First-degree relative with diabetes No High-risk race/ethnicity (eg, African American, Latino, Native American, [place] American, [place] Islander) No History of cardiovascular disease No Hypertension or on therapy for hypertension No High-density lipoprotein cholesterol level <35 mg/dL (4.23 mmol/L) and/or a triglyceride level >250 mg/dL (5.36  mmol/L) No Polycystic ovary syndrome No Physical inactivity No Other clinical condition associated with insulin resistance (eg, severe obesity, acanthosis nigricans) No Previous birth of an infant weighing ?4000 g No Previous stillbirth of unknown cause No Exam   Vitals:   05/01/19 0830  BP: 125/89  Pulse: (!) 115  Temp: 98.7 F (37.1 C)  Weight: 158 lb 11.2 oz (72 kg)   Fetal Heart Rate (bpm): 166  Uterus:       Pelvic Exam: Perineum: no hemorrhoids, normal perineum   Vulva: normal external genitalia, no lesions   Vagina:  normal mucosa, normal discharge   Cervix: no lesions and normal, pap smear done.    Adnexa: normal adnexa and no mass, fullness, tenderness   Bony Pelvis: average  System: General: well-developed, well-nourished female in no acute distress   Breast:  normal appearance, no masses or tenderness   Skin: normal coloration and turgor, no rashes   Neurologic: oriented, normal, negative, normal mood   Extremities: normal strength, tone, and muscle mass, ROM of all joints is normal   HEENT PERRLA, extraocular movement intact and sclera clear, anicteric   Mouth/Teeth mucous membranes moist, pharynx normal without lesions and dental hygiene good   Neck supple and no masses   Cardiovascular: regular rate and rhythm   Respiratory:  no respiratory distress, normal breath sounds   Abdomen: soft, non-tender; bowel sounds normal; no masses,  no organomegaly     Assessment:   Pregnancy: G1P0 Patient Active Problem List   Diagnosis Date Noted  . Encounter for supervision of normal first pregnancy in first trimester 04/23/2019  . Major depressive disorder, recurrent episode with seasonal pattern ([place]) 04/30/2017     Plan:   1. Encounter for supervision of normal first pregnancy in first trimester --Anticipatory guidance about next visits/weeks of pregnancy given.  - Obstetric Panel, Including HIV - Culture, OB Urine - [facility] AMB BABYSCRIPTS SCHEDULE OPTIMIZATION - [facility] AMB BABYSCRIPTS OPT IN - [place] MFM OB COMP + 14 WK; Future - Cytology - PAP - Cervicovaginal ancillary only( [place])  2. Hyperemesis gravidarum, antepartum --Pt with vomiting 10+ times in 24 hours --She is pushing fluids, and has urinated 6 times in 24 hours but her mouth feels dry --Some bleeding with emesis and heartburn/epigastric pain --Documented 20 lb weight loss as compared to pt weight at PCP at 5  weeks --Rx for Phenergan but cannot keep it down --Pt to take Phenergan Q6 hours, and place vaginally for next 2 days, add Zofran Q 8 hours PRN. --Note for pt to be out of work 2 days --Then, start Reglan TID at mealtimes, Phenergan QHS, Zofran Q 8 PRN for breakthrough symptoms --Also add daily Protonix --F/U in 2 weeks virtual to check on nausea regimen/change as needed  --Pt given precautions, reasons to present to MAU for IV fluids - TSH - metoCLOPramide (REGLAN) 10 MG tablet; Take 1 tablet (10 mg total) by mouth 3 (three) times daily with meals as needed for nausea.  Dispense: 30 tablet; Refill: 5 - pantoprazole (PROTONIX) 40 MG tablet; Take 1 tablet (40 mg total) by mouth daily.  Dispense: 30 tablet; Refill: 5 - ondansetron (ZOFRAN ODT) 4 MG disintegrating tablet; Take 1 tablet (4 mg total) by mouth every 8 (eight) hours as needed for nausea or vomiting.  Dispense: 20 tablet; Refill: 5    Initial labs drawn. Continue prenatal vitamins. Discussed and offered genetic screening options, including Quad screen/AFP, NIPS testing, and option to decline testing. Benefits/risks/alternatives reviewed. Pt  aware that anatomy US is form of genetic screening with lower accuracy in detecting trisomies than blood work.  Pt declines genetic screening today but will call her insurance about coverage.   NIPS: declined but will check with insurance. Ultrasound discussed; fetal anatomic survey: requested. Problem list reviewed and updated. The nature of [facility] with multiple MDs and other Advanced Practice Providers was explained to patient; also emphasized that residents, students are part of our team. Routine obstetric precautions reviewed. Return in about 1 week (around 05/08/2019).   Sharen Counter, CNM 05/01/19 11:35 AM

## 2019-05-01 NOTE — Progress Notes (Signed)
Pt presents for NOB visit. Pt declines genetic screening.  Pt c/o 20 lb weight loss d/t hyperemesis. No relief with Promethazine.  Pap due

## 2019-05-01 NOTE — Patient Instructions (Signed)
Hyperemesis Gravidarum Hyperemesis gravidarum is a severe form of nausea and vomiting that happens during pregnancy. Hyperemesis is worse than morning sickness. It may cause you to have nausea or vomiting all day for many days. It may keep you from eating and drinking enough food and liquids, which can lead to dehydration, malnutrition, and weight loss. Hyperemesis usually occurs during the first half (the first 20 weeks) of pregnancy. It often goes away once a woman is in her second half of pregnancy. However, sometimes hyperemesis continues through an entire pregnancy. What are the causes? The cause of this condition is not known. It may be related to changes in chemicals (hormones) in the body during pregnancy, such as the high level of pregnancy hormone (human chorionic gonadotropin) or the increase in the female sex hormone (estrogen). What are the signs or symptoms? Symptoms of this condition include:  Nausea that does not go away.  Vomiting that does not allow you to keep any food down.  Weight loss.  Body fluid loss (dehydration).  Having no desire to eat, or not liking food that you have previously enjoyed. How is this diagnosed? This condition may be diagnosed based on:  A physical exam.  Your medical history.  Your symptoms.  Blood tests.  Urine tests. How is this treated? This condition is managed by controlling symptoms. This may include:  Following an eating plan. This can help lessen nausea and vomiting.  Taking prescription medicines. An eating plan and medicines are often used together to help control symptoms. If medicines do not help relieve nausea and vomiting, you may need to receive fluids through an IV at the hospital. Follow these instructions at home: Eating and drinking   Avoid the following: ? Drinking fluids with meals. Try not to drink anything during the 30 minutes before and after your meals. ? Drinking more than 1 cup of fluid at a  time. ? Eating foods that trigger your symptoms. These may include spicy foods, coffee, high-fat foods, very sweet foods, and acidic foods. ? Skipping meals. Nausea can be more intense on an empty stomach. If you cannot tolerate food, do not force it. Try sucking on ice chips or other frozen items and make up for missed calories later. ? Lying down within 2 hours after eating. ? Being exposed to environmental triggers. These may include food smells, smoky rooms, closed spaces, rooms with strong smells, warm or humid places, overly loud and noisy rooms, and rooms with motion or flickering lights. Try eating meals in a well-ventilated area that is free of strong smells. ? Quick and sudden changes in your movement. ? Taking iron pills and multivitamins that contain iron. If you take prescription iron pills, do not stop taking them unless your health care provider approves. ? Preparing food. The smell of food can spoil your appetite or trigger nausea.  To help relieve your symptoms: ? Listen to your body. Everyone is different and has different preferences. Find what works best for you. ? Eat and drink slowly. ? Eat 5-6 small meals daily instead of 3 large meals. Eating small meals and snacks can help you avoid an empty stomach. ? In the morning, before getting out of bed, eat a couple of crackers to avoid moving around on an empty stomach. ? Try eating starchy foods as these are usually tolerated well. Examples include cereal, toast, bread, potatoes, pasta, rice, and pretzels. ? Include at least 1 serving of protein with your meals and snacks. Protein options include   lean meats, poultry, seafood, beans, nuts, nut butters, eggs, cheese, and yogurt. ? Try eating a protein-rich snack before bed. Examples of a protein-rick snack include cheese and crackers or a peanut butter sandwich made with 1 slice of whole-wheat bread and 1 tsp (5 g) of peanut butter. ? Eat or suck on things that have ginger in them.  It may help relieve nausea. Add  tsp ground ginger to hot tea or choose ginger tea. ? Try drinking 100% fruit juice or an electrolyte drink. An electrolyte drink contains sodium, potassium, and chloride. ? Drink fluids that are cold, clear, and carbonated or sour. Examples include lemonade, ginger ale, lemon-lime soda, ice water, and sparkling water. ? Brush your teeth or use a mouth rinse after meals. ? Talk with your health care provider about starting a supplement of vitamin B6. General instructions  Take over-the-counter and prescription medicines only as told by your health care provider.  Follow instructions from your health care provider about eating or drinking restrictions.  Continue to take your prenatal vitamins as told by your health care provider. If you are having trouble taking your prenatal vitamins, talk with your health care provider about different options.  Keep all follow-up and pre-birth (prenatal) visits as told by your health care provider. This is important. Contact a health care provider if:  You have pain in your abdomen.  You have a severe headache.  You have vision problems.  You are losing weight.  You feel weak or dizzy. Get help right away if:  You cannot drink fluids without vomiting.  You vomit blood.  You have constant nausea and vomiting.  You are very weak.  You faint.  You have a fever and your symptoms suddenly get worse. Summary  Hyperemesis gravidarum is a severe form of nausea and vomiting that happens during pregnancy.  Making some changes to your eating habits may help relieve nausea and vomiting.  This condition may be managed with medicine.  If medicines do not help relieve nausea and vomiting, you may need to receive fluids through an IV at the hospital. This information is not intended to replace advice given to you by your health care provider. Make sure you discuss any questions you have with your health care  provider. Document Revised: 02/21/2017 Document Reviewed: 10/01/2015 Elsevier Patient Education  2020 Elsevier Inc.  

## 2019-05-02 LAB — OBSTETRIC PANEL, INCLUDING HIV
Antibody Screen: NEGATIVE
Basophils Absolute: 0 10*3/uL (ref 0.0–0.2)
Basos: 0 %
EOS (ABSOLUTE): 0.1 10*3/uL (ref 0.0–0.4)
Eos: 0 %
HIV Screen 4th Generation wRfx: NONREACTIVE
Hematocrit: 44.6 % (ref 34.0–46.6)
Hemoglobin: 15.3 g/dL (ref 11.1–15.9)
Hepatitis B Surface Ag: NEGATIVE
Immature Grans (Abs): 0 10*3/uL (ref 0.0–0.1)
Immature Granulocytes: 0 %
Lymphocytes Absolute: 1.5 10*3/uL (ref 0.7–3.1)
Lymphs: 10 %
MCH: 30.5 pg (ref 26.6–33.0)
MCHC: 34.3 g/dL (ref 31.5–35.7)
MCV: 89 fL (ref 79–97)
Monocytes Absolute: 0.7 10*3/uL (ref 0.1–0.9)
Monocytes: 5 %
Neutrophils Absolute: 12.4 10*3/uL — ABNORMAL HIGH (ref 1.4–7.0)
Neutrophils: 85 %
Platelets: 416 10*3/uL (ref 150–450)
RBC: 5.02 x10E6/uL (ref 3.77–5.28)
RDW: 12.6 % (ref 11.7–15.4)
RPR Ser Ql: NONREACTIVE
Rh Factor: NEGATIVE
Rubella Antibodies, IGG: <0.9 index — ABNORMAL LOW (ref >0.99)
WBC: 14.7 10*3/uL — ABNORMAL HIGH (ref 3.4–10.8)

## 2019-05-02 LAB — CERVICOVAGINAL ANCILLARY ONLY
Chlamydia: NEGATIVE
Comment: NEGATIVE
Comment: NEGATIVE
Comment: NORMAL
Neisseria Gonorrhea: NEGATIVE
Trichomonas: NEGATIVE

## 2019-05-02 LAB — CYTOLOGY - PAP: Diagnosis: NEGATIVE

## 2019-05-02 LAB — TSH: TSH: 1.37 u[IU]/mL (ref 0.450–4.500)

## 2019-05-03 LAB — URINE CULTURE, OB REFLEX

## 2019-05-03 LAB — CULTURE, OB URINE

## 2019-05-04 ENCOUNTER — Encounter: Payer: Self-pay | Admitting: Advanced Practice Midwife

## 2019-05-04 DIAGNOSIS — Z283 Underimmunization status: Secondary | ICD-10-CM | POA: Insufficient documentation

## 2019-05-04 DIAGNOSIS — Z2839 Other underimmunization status: Secondary | ICD-10-CM | POA: Insufficient documentation

## 2019-05-08 ENCOUNTER — Other Ambulatory Visit: Payer: Self-pay

## 2019-05-08 ENCOUNTER — Encounter: Payer: Self-pay | Admitting: Obstetrics

## 2019-05-08 ENCOUNTER — Ambulatory Visit (INDEPENDENT_AMBULATORY_CARE_PROVIDER_SITE_OTHER): Payer: 59 | Admitting: Obstetrics

## 2019-05-08 VITALS — BP 116/81 | HR 94 | Wt 155.0 lb

## 2019-05-08 DIAGNOSIS — Z3401 Encounter for supervision of normal first pregnancy, first trimester: Secondary | ICD-10-CM

## 2019-05-08 DIAGNOSIS — O210 Mild hyperemesis gravidarum: Secondary | ICD-10-CM

## 2019-05-08 DIAGNOSIS — Z3A12 12 weeks gestation of pregnancy: Secondary | ICD-10-CM

## 2019-05-08 NOTE — Progress Notes (Signed)
Stated inserting phenger tablets in on tues 3x per day on but stopped on thurs

## 2019-05-08 NOTE — Progress Notes (Signed)
Subjective:  Jennifer Parker is a 29 y.o. G1P0 at [redacted]w[redacted]d being seen today for ongoing prenatal care.  She is currently monitored for the following issues for this low-risk pregnancy and has Major depressive disorder, recurrent episode with seasonal pattern (HCC); Encounter for supervision of normal first pregnancy in first trimester; and Rubella non-immune status, antepartum on their problem list.  Patient reports nausea, and had been Rx Phenergan 25 mg tabs vaginally for nausea and noted the onset of vaginal bleeding over the past week.  Contractions: Not present.  .  Movement: Absent. Denies leaking of fluid.   The following portions of the patient's history were reviewed and updated as appropriate: allergies, current medications, past family history, past medical history, past social history, past surgical history and problem list. Problem list updated.  Objective:   Vitals:   05/08/19 1609  BP: 116/81  Pulse: 94  Weight: 155 lb (70.3 kg)    Fetal Status:     Movement: Absent     General:  Alert, oriented and cooperative. Patient is in no acute distress.  Skin: Skin is warm and dry. No rash noted.   Cardiovascular: Normal heart rate noted  Respiratory: Normal respiratory effort, no problems with respiration noted  Abdomen: Soft, gravid, appropriate for gestational age. Pain/Pressure: Absent     Pelvic:  SSE:  No lacerations or bleeding observed  Extremities: Normal range of motion.  Edema: None  Mental Status: Normal mood and affect. Normal behavior. Normal judgment and thought content.   Urinalysis:      Assessment and Plan:  Pregnancy: G1P0 at [redacted]w[redacted]d  1. Encounter for supervision of normal first pregnancy in first trimester   2. Hyperemesis gravidarum, antepartum - clinically stable  Preterm labor symptoms and general obstetric precautions including but not limited to vaginal bleeding, contractions, leaking of fluid and fetal movement were reviewed in detail with the  patient. Please refer to After Visit Summary for other counseling recommendations.   Return in about 4 weeks (around 06/05/2019) for MyChart.   Brock Bad, MD 05/08/2019 4:35 PM

## 2019-05-16 ENCOUNTER — Telehealth (INDEPENDENT_AMBULATORY_CARE_PROVIDER_SITE_OTHER): Payer: 59 | Admitting: Obstetrics

## 2019-05-16 ENCOUNTER — Encounter: Payer: Self-pay | Admitting: Obstetrics

## 2019-05-16 DIAGNOSIS — Z3401 Encounter for supervision of normal first pregnancy, first trimester: Secondary | ICD-10-CM

## 2019-05-16 DIAGNOSIS — O210 Mild hyperemesis gravidarum: Secondary | ICD-10-CM

## 2019-05-16 DIAGNOSIS — Z283 Underimmunization status: Secondary | ICD-10-CM

## 2019-05-16 DIAGNOSIS — Z2839 Other underimmunization status: Secondary | ICD-10-CM

## 2019-05-16 DIAGNOSIS — O99891 Other specified diseases and conditions complicating pregnancy: Secondary | ICD-10-CM

## 2019-05-16 DIAGNOSIS — Z3A13 13 weeks gestation of pregnancy: Secondary | ICD-10-CM

## 2019-05-16 NOTE — Progress Notes (Signed)
   OBSTETRICS PRENATAL VIRTUAL VISIT ENCOUNTER NOTE  Provider location: Center for [facility]   I connected with Jennifer Parker on 05/16/19 at 10:00 AM EDT by MyChart Video Encounter at home and verified that I am speaking with the correct person using two identifiers.   I discussed the limitations, risks, security and privacy concerns of performing an evaluation and management service virtually and the availability of in person appointments. I also discussed with the patient that there may be a patient responsible charge related to this service. The patient expressed understanding and agreed to proceed. Subjective:  Jennifer Parker is a 29 y.o. G1P0 at [redacted]w[redacted]d being seen today for ongoing prenatal care.  She is currently monitored for the following issues for this low-risk pregnancy and has Major depressive disorder, recurrent episode with seasonal pattern (HCC); Encounter for supervision of normal first pregnancy in first trimester; and Rubella non-immune status, antepartum on their problem list.  Patient reports nausea and vomiting.   .  .   . Denies any leaking of fluid.   The following portions of the patient's history were reviewed and updated as appropriate: allergies, current medications, past family history, past medical history, past social history, past surgical history and problem list.   Objective:  There were no vitals filed for this visit.  Fetal Status:           General:  Alert, oriented and cooperative. Patient is in no acute distress.  Respiratory: Normal respiratory effort, no problems with respiration noted  Mental Status: Normal mood and affect. Normal behavior. Normal judgment and thought content.  Rest of physical exam deferred due to type of encounter  Imaging: No results found.  Assessment and Plan:  Pregnancy: G1P0 at [redacted]w[redacted]d 1. Encounter for supervision of normal first pregnancy in first trimester  2. Hyperemesis gravidarum,  antepartum  3. Rubella non-immune status, antepartum   Preterm labor symptoms and general obstetric precautions including but not limited to vaginal bleeding, contractions, leaking of fluid and fetal movement were reviewed in detail with the patient. I discussed the assessment and treatment plan with the patient. The patient was provided an opportunity to ask questions and all were answered. The patient agreed with the plan and demonstrated an understanding of the instructions. The patient was advised to call back or seek an in-person office evaluation/go to [facility]U at [facility] for any urgent or concerning symptoms. Please refer to After Visit Summary for other counseling recommendations.   I provided 10 minutes of face-to-face time during this encounter.  Return in about 4 weeks (around 06/13/2019) for MyChart.  Future Appointments  Date Time Provider Department Center  05/16/2019 10:00 AM Brock Bad, MD [facility] None  05/29/2019  8:15 AM Leftwich-Kirby, Wilmer Floor, CNM [facility] None  06/18/2019  1:00 PM Neysa Hotter, MD [facility] None  06/25/2019  9:45 AM [facility] [place] 2 [facility]    Coral Ceo, MD Center for [facility], [facility] 05/16/2019

## 2019-05-29 ENCOUNTER — Ambulatory Visit (INDEPENDENT_AMBULATORY_CARE_PROVIDER_SITE_OTHER): Payer: 59 | Admitting: Advanced Practice Midwife

## 2019-05-29 ENCOUNTER — Other Ambulatory Visit: Payer: Self-pay

## 2019-05-29 VITALS — BP 109/68 | HR 77 | Wt 158.5 lb

## 2019-05-29 DIAGNOSIS — R519 Headache, unspecified: Secondary | ICD-10-CM

## 2019-05-29 DIAGNOSIS — J302 Other seasonal allergic rhinitis: Secondary | ICD-10-CM

## 2019-05-29 DIAGNOSIS — O210 Mild hyperemesis gravidarum: Secondary | ICD-10-CM

## 2019-05-29 DIAGNOSIS — O219 Vomiting of pregnancy, unspecified: Secondary | ICD-10-CM

## 2019-05-29 DIAGNOSIS — Z3402 Encounter for supervision of normal first pregnancy, second trimester: Secondary | ICD-10-CM

## 2019-05-29 DIAGNOSIS — Z3A15 15 weeks gestation of pregnancy: Secondary | ICD-10-CM

## 2019-05-29 DIAGNOSIS — O26892 Other specified pregnancy related conditions, second trimester: Secondary | ICD-10-CM

## 2019-05-29 MED ORDER — BUTALBITAL-APAP-CAFFEINE 50-325-40 MG PO TABS: 1.0000 | ORAL_TABLET | Freq: Four times a day (QID) | ORAL | 0 refills | Status: DC | PRN

## 2019-05-29 MED ORDER — METOCLOPRAMIDE HCL 10 MG PO TABS: 10.0000 mg | ORAL_TABLET | Freq: Three times a day (TID) | ORAL | 5 refills | Status: DC | PRN

## 2019-05-29 NOTE — Progress Notes (Signed)
ROB/AFP/GENECTIC SCREEN.  C/o hyperemesis.

## 2019-05-29 NOTE — Progress Notes (Signed)
   PRENATAL VISIT NOTE  Subjective:  Jennifer Parker is a 29 y.o. G1P0 at [redacted]w[redacted]d being seen today for ongoing prenatal care.  She is currently monitored for the following issues for this low-risk pregnancy and has Major depressive disorder, recurrent episode with seasonal pattern (HCC); Supervision of other normal pregnancy, antepartum; and Rubella non-immune status, antepartum on their problem list.  Patient reports headache, nausea and vomiting.  Contractions: Not present. Vag. Bleeding: None.   . Denies leaking of fluid.   The following portions of the patient's history were reviewed and updated as appropriate: allergies, current medications, past family history, past medical history, past social history, past surgical history and problem list.   Objective:   Vitals:   05/29/19 0816  BP: 109/68  Pulse: 77  Weight: 158 lb 8 oz (71.9 kg)    Fetal Status: Fetal Heart Rate (bpm): 155         General:  Alert, oriented and cooperative. Patient is in no acute distress.  Skin: Skin is warm and dry. No rash noted.   Cardiovascular: Normal heart rate noted  Respiratory: Normal respiratory effort, no problems with respiration noted  Abdomen: Soft, gravid, appropriate for gestational age.  Pain/Pressure: Absent     Pelvic: Cervical exam deferred        Extremities: Normal range of motion.  Edema: None  Mental Status: Normal mood and affect. Normal behavior. Normal judgment and thought content.   Assessment and Plan:  Pregnancy: G1P0 at [redacted]w[redacted]d 1. Encounter for supervision of normal first pregnancy in second trimester --Anticipatory guidance about next visits/weeks of pregnancy given. --Next visit virtual --Anatomy US on 06/25/19 - AFP, Serum, Open Spina Bifida - Genetic Screening  2. Nausea and vomiting during pregnancy prior to [redacted] weeks gestation --Pt improved on most days but worse recently with allergies/sinus symptoms.   --Renew Rx for Reglan today, pt to continue current regimen,  add allergy tx (see below). - metoCLOPramide (REGLAN) 10 MG tablet; Take 1 tablet (10 mg total) by mouth 3 (three) times daily with meals as needed for nausea.  Dispense: 90 tablet; Refill: 5  3. Headache in pregnancy, antepartum, second trimester --Migraines since age 16.  Usually uses Excedrine Migraine.  Sinus pain from allergies triggering migraines recently.  --Fioricet for occasional use. - butalbital-acetaminophen-caffeine (FIORICET) 50-325-40 MG tablet; Take 1-2 tablets by mouth every 6 (six) hours as needed for headache.  Dispense: 20 tablet; Refill: 0  4. Seasonal allergies --Pt taking Claritin, prefers Claritin-D.  Safer now that she is out of first trimester.  Pt to use Claritin-D for short courses when sinuses are bad, use regular Claritin most days.    Preterm labor symptoms and general obstetric precautions including but not limited to vaginal bleeding, contractions, leaking of fluid and fetal movement were reviewed in detail with the patient. Please refer to After Visit Summary for other counseling recommendations.   No follow-ups on file.  Future Appointments  Date Time Provider Department Center  06/18/2019  1:00 PM Neysa Hotter, MD [facility] None  06/25/2019  9:45 AM [facility] [place] 2 [facility]    Sharen Counter, CNM

## 2019-05-31 LAB — AFP, SERUM, OPEN SPINA BIFIDA
AFP MoM: 1.29
AFP Value: 37.8 ng/mL
Gest. Age on Collection Date: 15.1 weeks
Maternal Age At EDD: 27.5 yr
OSBR Risk 1 IN: 4947
Test Results:: NEGATIVE
Weight: 159 [lb_av]

## 2019-06-11 ENCOUNTER — Encounter: Payer: Self-pay | Admitting: Advanced Practice Midwife

## 2019-06-18 ENCOUNTER — Ambulatory Visit (HOSPITAL_COMMUNITY): Payer: 59 | Admitting: Psychiatry

## 2019-06-25 ENCOUNTER — Other Ambulatory Visit: Payer: Self-pay | Admitting: *Deleted

## 2019-06-25 ENCOUNTER — Ambulatory Visit: Payer: 59 | Attending: Advanced Practice Midwife

## 2019-06-25 ENCOUNTER — Other Ambulatory Visit: Payer: Self-pay

## 2019-06-25 DIAGNOSIS — Z363 Encounter for antenatal screening for malformations: Secondary | ICD-10-CM | POA: Diagnosis not present

## 2019-06-25 DIAGNOSIS — Z3401 Encounter for supervision of normal first pregnancy, first trimester: Secondary | ICD-10-CM | POA: Diagnosis present

## 2019-06-25 DIAGNOSIS — Z3A19 19 weeks gestation of pregnancy: Secondary | ICD-10-CM

## 2019-06-25 DIAGNOSIS — Z362 Encounter for other antenatal screening follow-up: Secondary | ICD-10-CM

## 2019-06-26 ENCOUNTER — Telehealth (INDEPENDENT_AMBULATORY_CARE_PROVIDER_SITE_OTHER): Payer: 59 | Admitting: Advanced Practice Midwife

## 2019-06-26 VITALS — BP 104/65 | HR 81

## 2019-06-26 DIAGNOSIS — R102 Pelvic and perineal pain: Secondary | ICD-10-CM

## 2019-06-26 DIAGNOSIS — Z3402 Encounter for supervision of normal first pregnancy, second trimester: Secondary | ICD-10-CM

## 2019-06-26 DIAGNOSIS — O26899 Other specified pregnancy related conditions, unspecified trimester: Secondary | ICD-10-CM

## 2019-06-26 DIAGNOSIS — O99891 Other specified diseases and conditions complicating pregnancy: Secondary | ICD-10-CM

## 2019-06-26 DIAGNOSIS — O09899 Supervision of other high risk pregnancies, unspecified trimester: Secondary | ICD-10-CM

## 2019-06-26 DIAGNOSIS — Z283 Underimmunization status: Secondary | ICD-10-CM

## 2019-06-26 DIAGNOSIS — Z2839 Other underimmunization status: Secondary | ICD-10-CM

## 2019-06-26 DIAGNOSIS — Z3A19 19 weeks gestation of pregnancy: Secondary | ICD-10-CM

## 2019-06-26 MED ORDER — COMFORT FIT MATERNITY SUPP MED MISC: 1.0000 | Freq: Every day | 0 refills | Status: DC

## 2019-06-26 NOTE — Progress Notes (Signed)
OBSTETRICS PRENATAL VIRTUAL VISIT ENCOUNTER NOTE  Provider location: [facility] [place] [facility] [place]   I connected with Jennifer Parker on 06/26/19 at  1:40 PM EDT by MyChart Video Encounter at home and verified that I am speaking with the correct person using two identifiers.   I discussed the limitations, risks, security and privacy concerns of performing an evaluation and management service virtually and the availability of in person appointments. I also discussed with the patient that there may be a patient responsible charge related to this service. The patient expressed understanding and agreed to proceed. Subjective:  Jennifer Parker is a 29 y.o. G1P0 at [redacted]w[redacted]d being seen today for ongoing prenatal care.  She is currently monitored for the following issues for this low-risk pregnancy and has Major depressive disorder, recurrent episode with seasonal pattern ([place]); Supervision of other normal pregnancy, antepartum; and Rubella non-immune status, antepartum on their problem list.  Patient reports mild abdominal aching at work.  Contractions: Not present. Vag. Bleeding: None.  Movement: Present. Denies any leaking of fluid.   The following portions of the patient's history were reviewed and updated as appropriate: allergies, current medications, past family history, past medical history, past social history, past surgical history and problem list.   Objective:   Vitals:   06/26/19 1334  BP: 104/65  Pulse: 81    Fetal Status:     Movement: Present     General:  Alert, oriented and cooperative. Patient is in no acute distress.  Respiratory: Normal respiratory effort, no problems with respiration noted  Mental Status: Normal mood and affect. Normal behavior. Normal judgment and thought content.  Rest of physical exam deferred due to type of encounter  Imaging: [place] MFM OB COMP + 14 WK  Result Date:  06/25/2019 ----------------------------------------------------------------------  OBSTETRICS REPORT                       (Signed Final 06/25/2019 11:35 am) ---------------------------------------------------------------------- Patient Info  ID #:       [id number]                          D.O.B.:  08-20-1990 (29 yrs)  Name:       Jennifer Parker               Visit Date: 06/25/2019 10:34 am ---------------------------------------------------------------------- Performed By  Attending:        Johnell Comings MD         Ref. Address:     [address]  [place]                                                             [id number]  Performed By:     Ellin Saba        [facility]  Referred By:      [facility] ---------------------------------------------------------------------- Orders  #  Description                           Code        Ordered By  1  [place] MFM OB COMP + 14 WK                76805.01    Sachin Ferencz LEFTWICH-                                                       KIRBY ----------------------------------------------------------------------  #  Order #                     Accession #                Episode #  1  [id number]                   [id number]                 [id number] ---------------------------------------------------------------------- Indications  Encounter for antenatal screening for          Z36.3  malformations  [redacted] weeks gestation of pregnancy                Z3A.19  Neg MSAFP, Neg Horizon, Neg NIPS. ---------------------------------------------------------------------- Fetal Evaluation  Num Of Fetuses:         1  Fetal Heart Rate(bpm):  145  Cardiac Activity:       Observed  Presentation:           Variable   Placenta:               Anterior  P. Cord Insertion:      Visualized, central  Amniotic Fluid  AFI FV:      Within normal limits                              Largest Pocket(cm)                              5.9 ---------------------------------------------------------------------- Biometry  BPD:      45.6  mm     G. Age:  19w 5d  81  %    CI:        78.41   %    70 - 86                                                          FL/HC:      17.9   %    16.1 - 18.3  HC:      162.9  mm     G. Age:  19w 0d         45  %    HC/AC:      1.13        1.09 - 1.39  AC:      144.8  mm     G. Age:  19w 6d         72  %    FL/BPD:     63.8   %  FL:       29.1  mm     G. Age:  19w 0d         41  %    FL/AC:      20.1   %    20 - 24  HUM:      29.3  mm     G. Age:  19w 4d         65  %  CER:        20  mm     G. Age:  19w 0d         50  %  NFT:       3.3  mm  LV:        5.8  mm  CM:          4  mm  Est. FW:     290  gm    0 lb 10 oz      69  % ---------------------------------------------------------------------- OB History  Gravidity:    1 ---------------------------------------------------------------------- Gestational Age  LMP:           19w 0d        Date:  02/12/19                 EDD:   11/19/19  U/S Today:     19w 3d                                        EDD:   11/16/19  Best:          19w 0d     Det. By:  LMP  (02/12/19)          EDD:   11/19/19 ---------------------------------------------------------------------- Anatomy  Cranium:               Appears normal         LVOT:                   Appears normal  Cavum:                 Appears normal         Aortic Arch:            Not well visualized  Ventricles:            Appears normal         Ductal Arch:            Not well visualized  Choroid Plexus:        Appears normal         Diaphragm:              Appears normal  Cerebellum:            Appears normal         Stomach:                Appears normal, left                                                                         sided  Posterior Fossa:       Appears normal         Abdomen:                Appears normal  Nuchal Fold:           Appears normal         Abdominal Wall:         Appears nml (cord                                                                        insert, abd wall)  Face:                  Appears normal         Cord Vessels:           Appears normal (3                         (orbits and profile)                           vessel cord)  Lips:                  Appears normal         Kidneys:                Appear normal  Palate:                Appears normal         Bladder:                Appears normal  Thoracic:              Appears normal         Spine:                  Not well visualized  Heart:                 Appears normal  Upper Extremities:      Appears normal                         (4CH, axis, and                         situs)  RVOT:                  Appears normal         Lower Extremities:      Appears normal  Other:  Technically difficult due to fetal position. Nasal bone visualized. Heels          and 5th digit visualized. ---------------------------------------------------------------------- Cervix Uterus Adnexa  Cervix  Length:           3.51  cm.  Normal appearance by transabdominal scan.  Right Ovary  Within normal limits.  Left Ovary  Within normal limits. ---------------------------------------------------------------------- Comments  This patient was seen for a detailed fetal anatomy scan.  She denies any significant past medical history and denies  any problems in her current pregnancy.  She had a cell free DNA test earlier in her pregnancy which  indicated a low risk for trisomy 11, 84, and 13. A female fetus is  predicted.  She was informed that the fetal growth and amniotic fluid  level were appropriate for her gestational age.  There were no obvious fetal anomalies noted on today's  ultrasound exam. However, today's exam was limited due to  the fetal position.  The  patient was informed that anomalies may be missed due  to technical limitations. If the fetus is in a suboptimal position  or maternal habitus is increased, visualization of the fetus in  the maternal uterus may be impaired.  A follow-up exam was scheduled in 4 weeks to complete the  views of the fetal anatomy. ----------------------------------------------------------------------                   Ma Rings, MD Electronically Signed Final Report   06/25/2019 11:35 am ----------------------------------------------------------------------   Assessment and Plan:  Pregnancy: G1P0 at [redacted]w[redacted]d 1. Rubella non-immune status, antepartum   2. Encounter for supervision of normal first pregnancy in second trimester --Pt reports fetal movement, denies cramping, LOF, or vaginal bleeding --Anticipatory guidance about next visits/weeks of pregnancy given. --Next visit in 4 weeks virtual, then 4 more weeks for GTT   3. Pain of round ligament affecting pregnancy, antepartum --Aching bilateral lower abdomen pain after working all day. --Rest/ice/heat/warm bath/Tylenol/pregnancy support belt  - Elastic Bandages & Supports (COMFORT FIT MATERNITY SUPP MED) MISC; 1 Device by Does not apply route daily.  Dispense: 1 each; Refill: 0  Preterm labor symptoms and general obstetric precautions including but not limited to vaginal bleeding, contractions, leaking of fluid and fetal movement were reviewed in detail with the patient. I discussed the assessment and treatment plan with the patient. The patient was provided an opportunity to ask questions and all were answered. The patient agreed with the plan and demonstrated an understanding of the instructions. The patient was advised to call back or seek an in-person office evaluation/go to MAU at [facility] for any urgent or concerning symptoms. Please refer to After Visit Summary for other counseling recommendations.   I provided 10 minutes of  face-to-face time during this encounter.  Return in about 4 weeks (around 07/24/2019).  Future Appointments  Date Time Provider Department Center  07/24/2019  8:00 AM [facility] US1 [facility]    Sharen Counter, CNM [facility] for [employer], [facility]

## 2019-06-26 NOTE — Progress Notes (Signed)
Patient presents for Mychart visit for ROB. Patient identified with 2 pt identifiers. She is [redacted]w[redacted]d. Her bp today is 104/65. She may be interested in a abdominal support belt. Patient has no other concerns.

## 2019-07-24 ENCOUNTER — Ambulatory Visit: Payer: 59

## 2019-07-25 ENCOUNTER — Encounter: Payer: Self-pay | Admitting: Nurse Practitioner

## 2019-07-25 ENCOUNTER — Telehealth (INDEPENDENT_AMBULATORY_CARE_PROVIDER_SITE_OTHER): Payer: 59 | Admitting: Nurse Practitioner

## 2019-07-25 DIAGNOSIS — Z3402 Encounter for supervision of normal first pregnancy, second trimester: Secondary | ICD-10-CM

## 2019-07-25 DIAGNOSIS — O09899 Supervision of other high risk pregnancies, unspecified trimester: Secondary | ICD-10-CM

## 2019-07-25 DIAGNOSIS — Z3A23 23 weeks gestation of pregnancy: Secondary | ICD-10-CM

## 2019-07-25 DIAGNOSIS — Z283 Underimmunization status: Secondary | ICD-10-CM

## 2019-07-25 DIAGNOSIS — Z2839 Other underimmunization status: Secondary | ICD-10-CM

## 2019-07-25 DIAGNOSIS — O219 Vomiting of pregnancy, unspecified: Secondary | ICD-10-CM

## 2019-07-25 NOTE — Patient Instructions (Signed)
Drink at least 8 8-oz glasses of water every day.  Childbirth Education Options: [facility] Department Classes:  Childbirth education classes can help you get ready for a positive parenting experience. You can also meet other expectant parents and get free stuff for your baby. Each class runs for five weeks on the same night and costs $45 for the mother-to-be and her support person. Medicaid covers the cost if you are eligible. Call [phone] to register. [facility] Childbirth Education:  [phone] or [phone] or [email or web address]  Baby & Me Class: Discuss newborn & infant parenting and family adjustment issues with other new mothers in a relaxed environment. Each week brings a new speaker or baby-centered activity. We encourage new mothers to join [place] every Thursday at 11:00am. Babies birth until crawling. No registration or fee. Daddy [employer]: This course offers Dads-to-be the tools and knowledge needed to feel confident on their journey to becoming new fathers. Experienced dads, who have been trained as coaches, teach dads-to-be how to hold, comfort, diaper, swaddle and play with their infant while being able to support the new mom as well. A class for men taught by men. $25/dad Big Brother/Big Sister: Let your children share in the joy of a new brother or sister in this special class designed just for them. Class includes discussion about how families care for babies: swaddling, holding, diapering, safety as well as how they can be helpful in their new role. This class is designed for children ages 2 to 31, but any age is welcome. Please register each child individually. $5/child  Mom Talk: This mom-led group offers support and connection to mothers as they journey through the adjustments and struggles of that sometimes overwhelming first year after the birth of a child. Tuesdays at 10:00am and Thursdays at 6:00pm. Babies welcome. No registration or  fee. Breastfeeding Support Group: This group is a mother-to-mother support circle where moms have the opportunity to share their breastfeeding experiences. A Lactation Consultant is present for questions and concerns. Meets each Tuesday at 11:00am. No fee or registration. Breastfeeding Your Baby: Learn what to expect in the first days of breastfeeding your newborn.  This class will help you feel more confident with the skills needed to begin your breastfeeding experience. Many new mothers are concerned about breastfeeding after leaving the hospital. This class will also address the most common fears and challenges about breastfeeding during the first few weeks, months and beyond. (call for fee) Comfort Techniques and Tour: This 2 hour interactive class will provide you the opportunity to learn & practice hands-on techniques that can help relieve some of the discomfort of labor and encourage your baby to rotate toward the best position for birth. You and your partner will be able to try a variety of labor positions with birth balls and rebozos as well as practice breathing, relaxation, and visualization techniques. A tour of the [facility] is included with this class. $20 per registrant and support person Childbirth Class- Weekend Option: This class is a Weekend version of our Birth & Baby series. It is designed for parents who have a difficult time fitting several weeks of classes into their schedule. It covers the care of your newborn and the basics of labor and childbirth. It also includes a Maternity Care Center Tour of [facility] and lunch. The class is held two consecutive days: beginning on Friday evening from 6:30 - 8:30 p.m. and the next day, Saturday from 9 a.m. - 4 p.m. (  call for fee) Waterbirth Class: Interested in a waterbirth?  This informational class will help you discover whether waterbirth is the right fit for you. Education about waterbirth itself, supplies  you would need and how to assemble your support team is what you can expect from this class. Some obstetrical practices require this class in order to pursue a waterbirth. (Not all obstetrical practices offer waterbirth-check with your healthcare provider.) Register only the expectant mom, but you are encouraged to bring your partner to class! Required if planning waterbirth, no fee. Infant/Child CPR: Parents, grandparents, babysitters, and friends learn Cardio-Pulmonary Resuscitation skills for infants and children. You will also learn how to treat both conscious and unconscious choking in infants and children. This Family & Friends program does not offer certification. Register each participant individually to ensure that enough mannequins are available. (Call for fee) Grandparent Love: Expecting a grandbaby? This class is for you! Learn about the latest infant care and safety recommendations and ways to support your own child as he or she transitions into the parenting role. Taught by Registered Nurses who are childbirth instructors, but most importantly...they are grandmothers too! $10/person. Childbirth Class- Natural Childbirth: This series of 5 weekly classes is for expectant parents who want to learn and practice natural methods of coping with the process of labor and childbirth. Relaxation, breathing, massage, visualization, role of the partner, and helpful positioning are highlighted. Participants learn how to be confident in their body's ability to give birth. This class will empower and help parents make informed decisions about their own care. Includes discussion that will help new parents transition into the immediate postpartum period. [facility] is included. We suggest taking this class between 25-32 weeks, but it's only a recommendation. $75 per registrant and one support person or $30 Medicaid. Childbirth Class- 3 week Series: This option of 3 weekly classes  helps you and your labor partner prepare for childbirth. Newborn care, labor & birth, cesarean birth, pain management, and comfort techniques are discussed and a [place] [facility] is included. The class meets at the same time, on the same day of the week for 3 consecutive weeks beginning with the starting date you choose. $60 for registrant and one support person.  Marvelous Multiples: Expecting twins, triplets, or more? This class covers the differences in labor, birth, parenting, and breastfeeding issues that face multiples' parents. NICU tour is included. Led by a Certified Childbirth Educator who is the mother of twins. No fee. Caring for Baby: This class is for expectant and adoptive parents who want to learn and practice the most up-to-date newborn care for their babies. Focus is on birth through the first six weeks of life. Topics include feeding, bathing, diapering, crying, umbilical cord care, circumcision care and safe sleep. Parents learn to recognize symptoms of illness and when to call the pediatrician. Register only the mom-to-be and your partner or support person can plan to come with you! $10 per registrant and support person Childbirth Class- online option: This online class offers you the freedom to complete a Birth and Baby series in the comfort of your own home. The flexibility of this option allows you to review sections at your own pace, at times convenient to you and your support people. It includes additional video information, animations, quizzes, and extended activities. Get organized with helpful eClass tools, checklists, and trackers. Once you register online for the class, you will receive an email within a few days  to accept the invitation and begin the class when the time is right for you. The content will be available to you for 60 days. $60 for 60 days of online access for you and your support people.

## 2019-07-25 NOTE — Progress Notes (Signed)
OBSTETRICS PRENATAL VIRTUAL VISIT ENCOUNTER NOTE  Provider location: [facility]   I connected with Jennifer Parker on 07/25/19 at  1:40 PM EDT by MyChart Video Encounter at home and verified that I am speaking with the correct person using two identifiers.   I discussed the limitations, risks, security and privacy concerns of performing an evaluation and management service virtually and the availability of in person appointments. I also discussed with the patient that there may be a patient responsible charge related to this service. The patient expressed understanding and agreed to proceed. Subjective:  Jennifer Parker is a 29 y.o. G1P0 at [redacted]w[redacted]d being seen today for ongoing prenatal care.  She is currently monitored for the following issues for this low-risk pregnancy and has Major depressive disorder, recurrent episode with seasonal pattern (HCC); Supervision of other normal pregnancy, antepartum; and Rubella non-immune status, antepartum on their problem list.  Patient reports slight swelling in one ankle only, nausea continues but is not as bad, still has some round ligament pain.  Contractions: Not present. Vag. Bleeding: None.  Movement: Present. Denies any leaking of fluid.   The following portions of the patient's history were reviewed and updated as appropriate: allergies, current medications, past family history, past medical history, past social history, past surgical history and problem list.   Objective:  There were no vitals filed for this visit.  Fetal Status:     Movement: Present     General:  Alert, oriented and cooperative. Patient is in no acute distress.  Respiratory: Normal respiratory effort, no problems with respiration noted  Mental Status: Normal mood and affect. Normal behavior. Normal judgment and thought content.  Rest of physical exam deferred due to type of encounter  Imaging: No results found.  Assessment and  Plan:  Pregnancy: G1P0 at [redacted]w[redacted]d 1. Encounter for supervision of normal first pregnancy in second trimester Has not yet downloaded babyscripts Will begin to check BP weekly Will plan to get maternity support belt - has prescription.  Does not usually have a problem unless walking for exercise. Advised to check into childbirth and breastfeeding classes Has gone to in person work now.  Much busier.  Plans to take BP tonight at home. Advised to be fasting for glucola tests - can take AM meds with a sip of water.  2. Rubella non-immune status, antepartum   3. Nausea and vomiting during pregnancy prior to [redacted] weeks gestation Uses protonix, claritin, and sometimes zofran  Vomiting improving but still has nausea  Preterm labor symptoms and general obstetric precautions including but not limited to vaginal bleeding, contractions, leaking of fluid and fetal movement were reviewed in detail with the patient. I discussed the assessment and treatment plan with the patient. The patient was provided an opportunity to ask questions and all were answered. The patient agreed with the plan and demonstrated an understanding of the instructions. The patient was advised to call back or seek an in-person office evaluation/go to [facility]U at [facility] for any urgent or concerning symptoms. Please refer to After Visit Summary for other counseling recommendations.   I provided 8 minutes of face-to-face time during this encounter.  Return in about 4 weeks (around 08/22/2019) for as already scheduled for ROB and glucola.  Future Appointments  Date Time Provider Department Center  08/22/2019  8:00 AM [facility] None  08/22/2019  8:15 AM Conan Bowens, MD [facility] None    Currie Paris, NP  [facility]

## 2019-07-25 NOTE — Progress Notes (Signed)
Virtual ROB   CC: swelling in ankles , pt notes ligament pain when walking around the neighborhood. Has not been able to pick up belly band just yet.

## 2019-08-22 ENCOUNTER — Encounter: Payer: Self-pay | Admitting: Obstetrics and Gynecology

## 2019-08-22 ENCOUNTER — Other Ambulatory Visit: Payer: Self-pay

## 2019-08-22 ENCOUNTER — Other Ambulatory Visit: Payer: 59

## 2019-08-22 ENCOUNTER — Ambulatory Visit (INDEPENDENT_AMBULATORY_CARE_PROVIDER_SITE_OTHER): Payer: 59 | Admitting: Obstetrics and Gynecology

## 2019-08-22 VITALS — BP 113/75 | HR 91 | Wt 181.6 lb

## 2019-08-22 DIAGNOSIS — Z3402 Encounter for supervision of normal first pregnancy, second trimester: Secondary | ICD-10-CM

## 2019-08-22 DIAGNOSIS — Z2839 Other underimmunization status: Secondary | ICD-10-CM

## 2019-08-22 DIAGNOSIS — Z3182 Encounter for Rh incompatibility status: Secondary | ICD-10-CM | POA: Diagnosis not present

## 2019-08-22 DIAGNOSIS — Z23 Encounter for immunization: Secondary | ICD-10-CM | POA: Diagnosis not present

## 2019-08-22 DIAGNOSIS — Z6791 Unspecified blood type, Rh negative: Secondary | ICD-10-CM

## 2019-08-22 DIAGNOSIS — Z3480 Encounter for supervision of other normal pregnancy, unspecified trimester: Secondary | ICD-10-CM

## 2019-08-22 DIAGNOSIS — Z3A27 27 weeks gestation of pregnancy: Secondary | ICD-10-CM

## 2019-08-22 DIAGNOSIS — Z283 Underimmunization status: Secondary | ICD-10-CM

## 2019-08-22 MED ORDER — RHO D IMMUNE GLOBULIN 1500 UNIT/2ML IJ SOSY
300.0000 ug | PREFILLED_SYRINGE | Freq: Once | INTRAMUSCULAR | Status: AC
  Administered 2019-08-22: 300 ug via INTRAMUSCULAR

## 2019-08-22 NOTE — Progress Notes (Signed)
Pt is here for ROB, [redacted]w[redacted]d.

## 2019-08-22 NOTE — Progress Notes (Signed)
   PRENATAL VISIT NOTE  Subjective:  Jennifer Parker is a 29 y.o. G1P0 at 80w2dbeing seen today for ongoing prenatal care.  She is currently monitored for the following issues for this low-risk pregnancy and has Major depressive disorder, recurrent episode with seasonal pattern ([place]; Supervision of other normal pregnancy, antepartum; Rubella non-immune status, antepartum; and Rh negative state in antepartum period on their problem list.  Patient reports some left sided back pain.  Contractions: Not present. Vag. Bleeding: None.  Movement: Present. Denies leaking of fluid.   The following portions of the patient's history were reviewed and updated as appropriate: allergies, current medications, past family history, past medical history, past social history, past surgical history and problem list.   Objective:   Vitals:   08/22/19 0821  BP: 113/75  Pulse: 91  Weight: 181 lb 9.6 oz (82.4 kg)    Fetal Status: Fetal Heart Rate (bpm): 150   Movement: Present     General:  Alert, oriented and cooperative. Patient is in no acute distress.  Skin: Skin is warm and dry. No rash noted.   Cardiovascular: Normal heart rate noted  Respiratory: Normal respiratory effort, no problems with respiration noted  Abdomen: Soft, gravid, appropriate for gestational age.  Pain/Pressure: Present     Pelvic: Cervical exam deferred        Extremities: Normal range of motion.  Edema: Trace  Mental Status: Normal mood and affect. Normal behavior. Normal judgment and thought content.   Assessment and Plan:  Pregnancy: G1P0 at 215w2d1. Encounter for supervision of normal first pregnancy in second trimester - Glucose Tolerance, 2 Hours w/1 Hour - CBC - RPR - HIV Antibody (routine testing w rflx) - Tdap vaccine greater than or equal to 7yo IM - declines repeat US[place]or completion of anatomy  2. Supervision of other normal pregnancy, antepartum  3. Rubella non-immune status, antepartum MMR  postpartum  4. Rh negative state in antepartum period Rho gam today   Preterm labor symptoms and general obstetric precautions including but not limited to vaginal bleeding, contractions, leaking of fluid and fetal movement were reviewed in detail with the patient. Please refer to After Visit Summary for other counseling recommendations.   Return in about 2 weeks (around 09/05/2019) for low OB, in person.  No future appointments.  KeSloan LeiterMD

## 2019-08-23 LAB — GLUCOSE TOLERANCE, 2 HOURS W/ 1HR
Glucose, 1 hour: 115 mg/dL (ref 65–179)
Glucose, 2 hour: 87 mg/dL (ref 65–152)
Glucose, Fasting: 70 mg/dL (ref 65–91)

## 2019-08-23 LAB — CBC
Hematocrit: 33 % — ABNORMAL LOW (ref 34.0–46.6)
Hemoglobin: 10.7 g/dL — ABNORMAL LOW (ref 11.1–15.9)
MCH: 31.4 pg (ref 26.6–33.0)
MCHC: 32.4 g/dL (ref 31.5–35.7)
MCV: 97 fL (ref 79–97)
Platelets: 355 10*3/uL (ref 150–450)
RBC: 3.41 x10E6/uL — ABNORMAL LOW (ref 3.77–5.28)
RDW: 11.9 % (ref 11.7–15.4)
WBC: 11.8 10*3/uL — ABNORMAL HIGH (ref 3.4–10.8)

## 2019-08-23 LAB — RPR: RPR Ser Ql: NONREACTIVE

## 2019-08-23 LAB — HIV ANTIBODY (ROUTINE TESTING W REFLEX): HIV Screen 4th Generation wRfx: NONREACTIVE

## 2019-09-05 ENCOUNTER — Ambulatory Visit (INDEPENDENT_AMBULATORY_CARE_PROVIDER_SITE_OTHER): Payer: 59 | Admitting: Advanced Practice Midwife

## 2019-09-05 ENCOUNTER — Other Ambulatory Visit: Payer: Self-pay

## 2019-09-05 VITALS — BP 117/72 | HR 84 | Wt 182.0 lb

## 2019-09-05 DIAGNOSIS — Z9189 Other specified personal risk factors, not elsewhere classified: Secondary | ICD-10-CM

## 2019-09-05 DIAGNOSIS — Z3A29 29 weeks gestation of pregnancy: Secondary | ICD-10-CM

## 2019-09-05 DIAGNOSIS — Z3480 Encounter for supervision of other normal pregnancy, unspecified trimester: Secondary | ICD-10-CM

## 2019-09-05 DIAGNOSIS — O26893 Other specified pregnancy related conditions, third trimester: Secondary | ICD-10-CM

## 2019-09-05 DIAGNOSIS — R102 Pelvic and perineal pain: Secondary | ICD-10-CM

## 2019-09-05 NOTE — Patient Instructions (Addendum)
[facility] [address], [place], [place] [address]  Phone: [phone]    AREA PEDIATRIC/FAMILY PRACTICE PHYSICIANS  Central/Southeast Ginette Otto [phone]) . [facility] Melodie Bouillon, MD; Lum Babe, MD; Sheffield Slider, MD; Leveda Anna, MD; McDiarmid, MD; Jerene Bears, MD; Jennette Kettle, MD; Gwendolyn Grant, MD o [address] [place], [place] [address] o [phone] o Mon-Fri 8:30-12:30, 1:30-5:00 o Providers come to see babies at [facility] o Accepting Medicaid . [facility] [place] o Limited providers who accept newborns: Docia Chuck, MD; Kateri Plummer, MD; Paulino Rily, MD o [address], [place], [place] [address] o [phone] o Mon-Fri 8:00-5:30 o Babies seen by providers at [facility] o Does NOT accept Medicaid o Please call early in hospitalization for appointment (limited availability)  . [facility] Fatima Sanger, MD o [address] [place], [place] [address] o [phone] o Mon, Tue, Thur, Fri 8:30-5:00, Wed 10:00-7:00 (closed 1-2pm) o Babies seen by [facility] providers o Accepting Medicaid . Donnie Coffin - Pediatrician Fae Pippin, MD o [address], [place], [place] [address] o [phone] o Mon-Fri 8:30-5:00, Sat 8:30-12:00 o Provider comes to see babies at [facility] o Accepting Medicaid o Must have been referred from current patients or contacted office prior to delivery . [facility]) Leotis Pain, MD; Ave Filter, MD; Luna Fuse, MD; Kennedy Bucker, MD; Konrad Dolores, MD; Kathlene November, MD; Jenne Campus, MD; Lubertha South, MD; Wynetta Emery, MD; Duffy Rhody, MD; Gerre Couch, NP; Shirl Harris, NP o [address] [place], [place], [place] [address] o [phone] o Mon, Tue, Thur, Fri 8:30-5:30, Wed 9:30-5:30, Sat 8:30-12:30 o Babies seen by [facility] providers o Accepting Medicaid o Only accepting infants of first-time parents or siblings of  current patients [facility] discharge coordinator will make follow-up appointment . Cyril Mourning o [address] [place], [place]  [address] o [phone]   Fax - [phone] . [facility] o [address], [place], [place]  [address] o Phone - [phone]   Fax - [phone] . Lucio Edward o [address], [place], [place]  [address] o [phone]  East/Northeast [place] [phone]) . [facility] Jorge Mandril, MD; Alita Chyle, MD; Princella Ion, MD; MD; Earlene Plater, MD; Jamesetta Orleans, MD; Alvera Novel, MD; Clarene Duke, MD; Rana Snare, MD; Carmon Ginsberg, MD; Alinda Money, MD; Hosie Poisson, MD; Mayford Knife, MD o [address], [place], [place] [address] o [phone] o Mon-Fri 8:30-5:00 (extended evenings Mon-Thur as needed), Sat-Sun 10:00-1:00 o Providers come to see babies at [facility] o Accepting Medicaid for families of first-time babies and families with all children in the household age 58 and under. Must register with office prior to making appointment (M-F only). [facility] Odella Aquas, NP; Lynelle Doctor, MD; Susann Givens, MD; [place], [place] o [address], [place], [place] [address] o [phone] o Mon-Fri 8:00-5:00 o Babies seen by providers at [facility] o Does NOT accept Medicaid/Commercial Insurance Only . [facility] [place] ([facility])  Suzette Battiest, MD; Zachery Dauer, MD; Stefan Church, MD; Sabino Dick, MD; Quitman Livings, MD; Farris Has, MD; Gaynell Face, MD; Betha Loa, MD; Colon Flattery, MD; Clifton James, MD o [address] [place] [place], [place] [address] o [phone] o Mon-Fri 8:30-5:30, Sat (Oct.-Mar.) 9:00-1:00 o Babies seen by providers at [facility] o Accepting [facility] [phone]) . [facility] Dimitri, MD; Sheliah Hatch, MD o [address], [place], [place] [address] o [phone] o Mon-Fri 8:30-5:00, Sat 8:30-12:00 o  Providers come to see babies at [facility] o Does NOT accept Medicaid . [facility] Cindy Hazyo Becker, [place]; AshlandHagler, MD; Oakland[place]ifres, [place]; Wynelle LinkSun, MD; Azucena CecilSwayne, MD o [address], [place], [place] [address] o [phone] o Mon-Fri 8:00-5:00 o Babies seen by providers at [facility] o Does NOT accept Medicaid o Only accepting babies of parents who are patients o Please call early in hospitalization for appointment (limited availability) . [facility] Lamar Beneso Clark, MD; Abran CantorFrye, MD; Early OsmondKelleher, MD; Cherre HugerMack, NP; Hyacinth MeekerMiller, MD; Dwan Bolt'Keller, MD; Jarold MottoPatterson, NP; Dario GuardianPudlo, MD; Talmage NapPuzio, MD; Maisie Fushomas, MD; Pricilla Holmucker, MD; Tama Highwiselton, MD o [address], [place], [place] [address] o [phone] o Mon-Fri 8:00-5:00, Sat 9:00-12:00 o Providers come to see babies at [facility] o Does NOT accept [facility] [place] [facility]  [place] [phone]) . [facility] o Limited providers accepting new patients: Drema PryBrake, NP; NewvilleWharton, PA o [address], [place], [place] [address] o [phone] o Mon-Fri 8:00-5:00 o Babies seen by providers at [facility] o Does NOT accept Medicaid o Only accepting babies of parents who are patients o Please call early in hospitalization for appointment (limited availability) . [facility] Luan Pullingo Gay, MD; Nash DimmerQuinlan, MD o [address] [place], [place] [address] o [phone] (press 1 to schedule appointment) o Mon-Fri 8:00-5:00 o Providers come to see babies at [facility] o Does NOT accept Medicaid . [facility] Cristino Marteso Mazer, MD o [address] [place], [place] [address] o [phone] o Mon-Fri 8:30-5:00 (lunch 12:30-1:00), extended hours by appointment only Wed 5:00-6:30 o Babies seen by [facility] providers o Accepting Medicaid . [facility] o Banks, MD; SwazilandJordan, MD; Hassan RowanKoberlein, MD o [address], [place], [place] [address] o [phone] o Mon-Fri 8:00-5:00 o Babies seen by [facility] providers o Does NOT accept Medicaid . [facility] at  [place] [address] Sohoo Parker, MD; Durene CalHunter, MD; GrottoesWallace, DO o [address] [place], [place] [address] o [phone] o Mon-Fri 8:00-5:00 o Babies seen by [facility] providers o Does NOT accept Medicaid . [facility] o LawtellBrandon, [place]; [place] HamiltonBrecken, [place]; Dieterichhristy, NP; Avis Epleyees, MD; Vonna KotykeClaire, MD; Clance BolleWeese, MD; Stevphen RochesterHansen, NP; Arvilla MarketMills, NP; Ann MakiParrish, NP; Otis DialsSmoot, NP; Vaughan BastaSummer, MD; DorrVapne, MD o [address] [place], [place] [address] o [phone] o Mon-Fri 8:30-5:00, Sat 10:00-1:00 o Providers come to see babies at [facility] o Does NOT accept Medicaid o Free prenatal information session Tuesdays at 4:45pm . [facility] Luna Kitchenso Bouska, MD; BellflowerGordon, [place]; [place], [place]; Weber, [place] o [address] [place] [address] o [phone] o Mon-Fri 7:30-5:30 o Babies seen by [facility] providers . [facility] o [address], [place], [place]  [address] o [phone]   Fax - [phone]  [place] [phone] & [phone]) . [facility] Alphonsa Overallo Reese, MD o [address] [place], [place] [address] o [phone] o Mon-Thur 8:00-6:00 o Providers come to see babies at [facility] o Accepting Medicaid . [facility] Zenon Mayoo Anderson, NP; Cyndia BentBadger, MD; WoodfieldBeal, [place]; NahuntaSpencer, [place] o [address] [place], [place] [address] o [phone] o Mon-Thur 7:30-7:30, Fri 7:30-4:30 o Babies seen by [facility] providers o Accepting Medicaid . [facility] Cheryle Horsfallo Agbuya, MD; Janene HarveyKlett, NP; Vonita Mossomgoolam, MD o [address], [place], [place] [address] o [phone] o Mon-Fri 8:30-5:00, Sat 8:30-12:00 o Providers come to see babies at [facility] o Accepting Medicaid o Must have "Meet & Greet"  appointment at office prior to delivery . [facility]) Llana Aliment, MD; Earlene Plater, MD; Lucretia Roers, MD o [address], [place], [place]  [address] o [phone] o Mon-Wed 8:00-6:00, Thur-Fri 8:00-5:00, Sat 9:00-12:00 o Providers come to see babies at [facility] o Does NOT accept Medicaid o Only accepting siblings of current patients . [facility]  o [address], [place], [place]  [address] o [phone]   Fax - [phone] . [facility] o [phone] [address], [place], [place]  [address] o [phone]   Fax - [phone]  [place] [phone] & [phone]) . Nature conservation officer at [facility] o [place], DO; [place], DO o [address] [place], [place] [address] o [phone] o Mon-Fri 7:00-5:00 o Babies seen by [facility] providers o Does NOT accept Medicaid . [facility] Ellis Savage, MD; [place], [place]; [place], [place] o [address], [place], [place] [address] o [phone] o Mon-Fri 8:00-5:00 o Babies seen by [facility] providers o Accepting Medicaid . [facility] - [address] Franne Forts, MD; [place], [place]; [place], NP; [place], [place] o [address] [place], [place], [place] [address] o [phone] o Mon-Fri 8:00-5:00 o Babies seen by providers at [facility] o Accepting [facility] [place] [phone]) . [facility] [place], [place] o [address] [place], [place] [address] o [phone] o Mon-Fri 8:00-5:00 o Babies seen by [facility] providers o Does NOT accept Medicaid o Limited availability, please call early in hospitalization to schedule follow-up . [facility] Jolee Ewing, PA; Eddie Candle, MD; [place], MD; [place], [place]; Constance Goltz, MD; [place], [place] o [address] [facility] [address], [place], [place] [address] o [phone] o Mon-Fri 8:30-5:00, Sat 9:00-12:00 o Babies seen by providers at [facility] o Accepting Medicaid o Please register online then schedule online or call  office o [email or web address] . [facility]) Samuella Bruin, NP; Lucianne Muss, MD; Lanier Clam, PA o [address], [place], [place] [address] o [phone] o Mon-Fri 8:00-5:00 o Babies seen by providers at [facility] o Accepting Medicaid . [facility]) Sharin Mons, MD; Reed Breech, NP; Shelva Majestic, MD o [address], [place], [place] [address] o [phone] o Mon-Fri 8:00-5:30, Sat&Sun by appointment (phones open at 8:30) o Babies seen by [facility] providers o Accepting Medicaid o Must be a first-time baby or sibling of current patient . [facility] [address], [place], [place]  [address] o [phone]   Fax - [phone]  [place] [phone] & [phone]) . [facility] o [place], [place]; [place], [place]; Dimple Casey, MD; [place], [place]; Carolyne Fiscal, MD o [address] [place], [place] [address] o [phone] o Mon-Thur 8:00-7:00, Fri 8:00-5:00, Sat 8:00-12:00, Sun 9:00-12:00 o Babies seen by [employer] providers o Accepting Medicaid . [facility] at [facility], MD; Gaynell Face, MD; [facility], MD o [address], [place], [place] [address] o [phone] o Mon-Thur 8:00-5:00 o Babies seen by providers at [facility] o Accepting Medicaid . [facility] at [facility], MD; Coe-Goins, MD; Madilyn Fireman, MD; Melvyn Neth, MD; List, MD; Lazarus Salines, MD; Gaynell Face, MD; Berneda Rose, MD;  Flora Lipps, MD; Beryl Meager, MD; Luther Redo, MD; Lavonia Drafts, MD; Kellie Simmering, MD o [address] [place] [place], [place] [address] o [phone] o Mon-Fri 8:00-5:30, Sat (Oct.-Mar.) 9:00-1:00 o Babies seen by providers at [facility] o Accepting Medicaid o Must fill out new patient packet, available online at [email or web address] . [facility] - [facility] at [facility]) Simone Curia, NP; Tiburcio Pea, NP; Tresa Endo, NP; Whitney Post, MD; [place], [place]; Hennie Duos, MD; Wynne Dust, MD; Kavin Leech, NP o [address], [place], [place] [address] o [phone] o Mon-Thur 8:00-5:30, Fri 8:00-5:00 o Babies seen by providers at [facility] o Accepting [facility] [phone]) . [facility] o [place], [place]; [place], MD; Tanya Nones, MD; [place], [place] o [address] [place], [place] [address] o [phone] o Mon-Fri 8:00-5:00 o Babies seen by providers at [facility] o Accepting [facility] [phone]) . [facility] at [facility] o [place], DO; Lenise Arena, MD; [place], [place] o [address], [place], [place] [address] o [phone] o Mon-Fri 8:00-5:00 o Babies seen by providers at [facility] o Does NOT accept Medicaid o Limited appointment availability, please call early in hospitalization  . Nature conservation officer at [facility] o [place], DO; [place], MD o [address], [place], [place] [address] o [phone] o Mon-Fri 8:00-5:00 o Babies seen by [facility] providers o Does NOT accept Medicaid . [facility] Lorrine Kin, MD; Ninetta Lights, MD; [place], [place]; [place], MD o [address], [place], [place] [address] o [phone] o Mon-Fri 8:00-5:00 o After hours clinic [facility] [place], [place] [address]) [phone] Mon-Fri 5:00-8:00, Sat 12:00-6:00, Sun 10:00-4:00 o Babies seen by [facility] providers o Accepting Medicaid . [facility] at [facility] o [address] [place], [place]  [address] o [phone]   Fax - [phone]  [place] [phone]) . Nature conservation officer at [facility], MD o [address] [place], [place], [place] [address] o [phone] o Mon-Fri 8:00-5:00 o Babies seen by [facility] providers o Does NOT accept Medicaid . [facility] -  [place] [facility] at [place]) Tomi Likens, MD o [address] [place] [address] [place], [place] [address] o [phone] o Mon-Thur 8:00-7:00, Fri 8:00-5:00, Sat 8:00-12:00 o Babies seen by providers at [facility] o Accepting Medicaid - but does not have vaccinations in office (must be received elsewhere) o Limited availability, please call early in hospitalization  [place] ([phone]) . [facility]  o Wyvonne Lenz, MD o [address], [place] [address] o [phone]  Fax [phone]

## 2019-09-05 NOTE — Progress Notes (Signed)
   PRENATAL VISIT NOTE  Subjective:  Jennifer Parker is a 29 y.o. G1P0 at [redacted]w[redacted]d being seen today for ongoing prenatal care.  She is currently monitored for the following issues for this low-risk pregnancy and has Major depressive disorder, recurrent episode with seasonal pattern (HCC); Supervision of other normal pregnancy, antepartum; Rubella non-immune status, antepartum; and Rh negative state in antepartum period on their problem list.  Patient reports pelvic pain, mostly left side, radiates in to vagina, worse when standing.  Contractions: Not present. Vag. Bleeding: None.  Movement: Present. Denies leaking of fluid.   The following portions of the patient's history were reviewed and updated as appropriate: allergies, current medications, past family history, past medical history, past social history, past surgical history and problem list.   Objective:   Vitals:   09/05/19 0813  BP: 117/72  Pulse: 84  Weight: 182 lb (82.6 kg)    Fetal Status: Fetal Heart Rate (bpm): 150   Movement: Present     General:  Alert, oriented and cooperative. Patient is in no acute distress.  Skin: Skin is warm and dry. No rash noted.   Cardiovascular: Normal heart rate noted  Respiratory: Normal respiratory effort, no problems with respiration noted  Abdomen: Soft, gravid, appropriate for gestational age.  Pain/Pressure: Present     Pelvic: Cervical exam deferred        Extremities: Normal range of motion.     Mental Status: Normal mood and affect. Normal behavior. Normal judgment and thought content.   Assessment and Plan:  Pregnancy: G1P0 at [redacted]w[redacted]d 1. Supervision of other normal pregnancy, antepartum --Anticipatory guidance about next visits/weeks of pregnancy given. --Pt and her husband are present today and interested in developing a birth plan --Pt enrolled in childbirth classes, may consider waterbirth, next visit with midwife to discuss birth plan --visit in office in 4 weeks with CNM  2.  At risk for postpartum depression --Pt was on antidepressants before pregnancy, doing well now without but is concerned about PPD.   --Start with counseling with IBH, and consider counseling in the community, list provided - Ambulatory referral to Integrated Behavioral Health  3. Pelvic pain affecting pregnancy in third trimester, antepartum --Rest/ice/heat/warm bath/Tylenol/pregnancy support belt --Try positions to rotate baby as pain is related to baby pressing against pelvis in current fetal position  Preterm labor symptoms and general obstetric precautions including but not limited to vaginal bleeding, contractions, leaking of fluid and fetal movement were reviewed in detail with the patient. Please refer to After Visit Summary for other counseling recommendations.   Return in about 4 weeks (around 10/03/2019).  Future Appointments  Date Time Provider Department Center  10/03/2019  8:15 AM Marvetta Gibbons, Brand Males, NP [facility] None    Sharen Counter, CNM

## 2019-09-05 NOTE — Progress Notes (Signed)
Pt is having some vaginal/pelvic pain.

## 2019-10-01 ENCOUNTER — Ambulatory Visit (INDEPENDENT_AMBULATORY_CARE_PROVIDER_SITE_OTHER): Payer: 59 | Admitting: Licensed Clinical Social Worker

## 2019-10-01 ENCOUNTER — Ambulatory Visit (INDEPENDENT_AMBULATORY_CARE_PROVIDER_SITE_OTHER): Payer: 59 | Admitting: Advanced Practice Midwife

## 2019-10-01 ENCOUNTER — Other Ambulatory Visit: Payer: Self-pay

## 2019-10-01 VITALS — BP 119/80 | HR 92 | Wt 190.0 lb

## 2019-10-01 DIAGNOSIS — Z3A33 33 weeks gestation of pregnancy: Secondary | ICD-10-CM

## 2019-10-01 DIAGNOSIS — O26893 Other specified pregnancy related conditions, third trimester: Secondary | ICD-10-CM

## 2019-10-01 DIAGNOSIS — Z3480 Encounter for supervision of other normal pregnancy, unspecified trimester: Secondary | ICD-10-CM

## 2019-10-01 DIAGNOSIS — R102 Pelvic and perineal pain: Secondary | ICD-10-CM

## 2019-10-01 DIAGNOSIS — Z9189 Other specified personal risk factors, not elsewhere classified: Secondary | ICD-10-CM

## 2019-10-01 NOTE — Progress Notes (Signed)
Patient reports fetal movement with irregular contractions. 

## 2019-10-01 NOTE — Progress Notes (Signed)
   PRENATAL VISIT NOTE  Subjective:  Jennifer Parker is a 29 y.o. G1P0 at [redacted]w[redacted]d being seen today for ongoing prenatal care.  She is currently monitored for the following issues for this low-risk pregnancy and has Major depressive disorder, recurrent episode with seasonal pattern (HCC); Supervision of other normal pregnancy, antepartum; Rubella non-immune status, antepartum; and Rh negative state in antepartum period on their problem list.  Patient reports round ligament pain occasionally.   .  .   . Denies leaking of fluid.   The following portions of the patient's history were reviewed and updated as appropriate: allergies, current medications, past family history, past medical history, past social history, past surgical history and problem list.   Objective:  There were no vitals filed for this visit.  Fetal Status:           General:  Alert, oriented and cooperative. Patient is in no acute distress.  Skin: Skin is warm and dry. No rash noted.   Cardiovascular: Normal heart rate noted  Respiratory: Normal respiratory effort, no problems with respiration noted  Abdomen: Soft, gravid, appropriate for gestational age.        Pelvic: Cervical exam deferred        Extremities: Normal range of motion.     Mental Status: Normal mood and affect. Normal behavior. Normal judgment and thought content.   Assessment and Plan:  Pregnancy: G1P0 at [redacted]w[redacted]d 1. Supervision of other normal pregnancy, antepartum --Anticipatory guidance about next visits/weeks of pregnancy given. --Pt taking childbirth classes, bringing birth plan to next appt --Interested in water but not sure she wants a natural birth, so may take class and consider options. --Next visit in person in 3 weeks for GBS  2. Pelvic pain affecting pregnancy in third trimester, antepartum --Occasional pain in LLQ, improved with pregnancy support belt. --Rest/ice/heat/warm bath/Tylenol/pregnancy support belt   3. [redacted] weeks gestation of  pregnancy   Preterm labor symptoms and general obstetric precautions including but not limited to vaginal bleeding, contractions, leaking of fluid and fetal movement were reviewed in detail with the patient. Please refer to After Visit Summary for other counseling recommendations.   No follow-ups on file.  Future Appointments  Date Time Provider Department Center  10/01/2019 10:00 AM Gwyndolyn Saxon, LCSW [facility] None  10/01/2019 10:55 AM Leftwich-Kirby, Wilmer Floor, CNM [facility] None    Sharen Counter, CNM

## 2019-10-01 NOTE — BH Specialist Note (Signed)
Integrated Behavioral Health Initial Visit  MRN: [id number] Name: Jennifer Parker  Number of Integrated Behavioral Health Clinician visits:: 1 Session Start time: 10:03am  Session End time: 10:55am Total time: 52 mins in person at [facility]   Type of Service: Integrated Behavioral Health- Individual Interpretor:no  Interpretor Name and Language: None    Warm Hand Off Completed.       SUBJECTIVE: Jennifer Parker is a 29 y.o. female accompanied by n/a Patient was referred by Jennifer Parker for at risk postpartum depression  Patient reports the following symptoms/concerns: Loss of interest, fatigue,  Duration of problem: approx four months ; Severity of problem: mild   OBJECTIVE: Mood: Good  and Affect: tearful when speaking about spouse  Risk of harm to self or others: Jennifer Parker denies risk of harm to self and/or others   LIFE CONTEXT: Family and Social: Extended family lives in [place] and [place]. Lives with Spouse in [place]  School/Work: Speech Therapist  Self-Care: n/a Life Changes: n/a  GOALS ADDRESSED: Patient will: 1. Reduce symptoms of:  2. Increase knowledge of postpartum depression; learn and implement coping skills to identify and alleviate symptoms   3. Demonstrate ability to: self manage symptoms   INTERVENTIONS: Interventions utilized: supportive and  psychoeducation  Standardized Assessments completed:    Integrated Behavioral Health [facility] 10/01/2019 in [facility]  PHQ-9 Total Score 10      ASSESSMENT: Patient currently experiencing anxiety affecting pregnancy    Patient may benefit from integrated behavioral health   PLAN: 1. Follow up with behavioral health clinician on : 10/29/2019 via mychart  2. Behavioral recommendations: Prioritize sleep, prenatal yoga, mindfulness techniques, strongly advise Jennifer Parker to engage in self care techniques  3. Referral(s): n/a 4. "From scale of 1-10, how likely are you to  follow plan?":  Jennifer Saxon, Jennifer Parker

## 2019-10-01 NOTE — Patient Instructions (Signed)
Third Trimester of Pregnancy The third trimester is from week 28 through week 40 (months 7 through 9). The third trimester is a time when the unborn baby (fetus) is growing rapidly. At the end of the ninth month, the fetus is about 20 inches in length and weighs 6-10 pounds. Body changes during your third trimester Your body will continue to go through many changes during pregnancy. The changes vary from woman to woman. During the third trimester:  Your weight will continue to increase. You can expect to gain 25-35 pounds (11-16 kg) by the end of the pregnancy.  You may begin to get stretch marks on your hips, abdomen, and breasts.  You may urinate more often because the fetus is moving lower into your pelvis and pressing on your bladder.  You may develop or continue to have heartburn. This is caused by increased hormones that slow down muscles in the digestive tract.  You may develop or continue to have constipation because increased hormones slow digestion and cause the muscles that push waste through your intestines to relax.  You may develop hemorrhoids. These are swollen veins (varicose veins) in the rectum that can itch or be painful.  You may develop swollen, bulging veins (varicose veins) in your legs.  You may have increased body aches in the pelvis, back, or thighs. This is due to weight gain and increased hormones that are relaxing your joints.  You may have changes in your hair. These can include thickening of your hair, rapid growth, and changes in texture. Some women also have hair loss during or after pregnancy, or hair that feels dry or thin. Your hair will most likely return to normal after your baby is born.  Your breasts will continue to grow and they will continue to become tender. A yellow fluid (colostrum) may leak from your breasts. This is the first milk you are producing for your baby.  Your belly button may stick out.  You may notice more swelling in your hands,  face, or ankles.  You may have increased tingling or numbness in your hands, arms, and legs. The skin on your belly may also feel numb.  You may feel short of breath because of your expanding uterus.  You may have more problems sleeping. This can be caused by the size of your belly, increased need to urinate, and an increase in your body's metabolism.  You may notice the fetus "dropping," or moving lower in your abdomen (lightening).  You may have increased vaginal discharge.  You may notice your joints feel loose and you may have pain around your pelvic bone. What to expect at prenatal visits You will have prenatal exams every 2 weeks until week 36. Then you will have weekly prenatal exams. During a routine prenatal visit:  You will be weighed to make sure you and the baby are growing normally.  Your blood pressure will be taken.  Your abdomen will be measured to track your baby's growth.  The fetal heartbeat will be listened to.  Any test results from the previous visit will be discussed.  You may have a cervical check near your due date to see if your cervix has softened or thinned (effaced).  You will be tested for Group B streptococcus. This happens between 35 and 37 weeks. Your health care provider may ask you:  What your birth plan is.  How you are feeling.  If you are feeling the baby move.  If you have had any abnormal   symptoms, such as leaking fluid, bleeding, severe headaches, or abdominal cramping.  If you are using any tobacco products, including cigarettes, chewing tobacco, and electronic cigarettes.  If you have any questions. Other tests or screenings that may be performed during your third trimester include:  Blood tests that check for low iron levels (anemia).  Fetal testing to check the health, activity level, and growth of the fetus. Testing is done if you have certain medical conditions or if there are problems during the pregnancy.  Nonstress test  (NST). This test checks the health of your baby to make sure there are no signs of problems, such as the baby not getting enough oxygen. During this test, a belt is placed around your belly. The baby is made to move, and its heart rate is monitored during movement. What is false labor? False labor is a condition in which you feel small, irregular tightenings of the muscles in the womb (contractions) that usually go away with rest, changing position, or drinking water. These are called Braxton Hicks contractions. Contractions may last for hours, days, or even weeks before true labor sets in. If contractions come at regular intervals, become more frequent, increase in intensity, or become painful, you should see your health care provider. What are the signs of labor?  Abdominal cramps.  Regular contractions that start at 10 minutes apart and become stronger and more frequent with time.  Contractions that start on the top of the uterus and spread down to the lower abdomen and back.  Increased pelvic pressure and dull back pain.  A watery or bloody mucus discharge that comes from the vagina.  Leaking of amniotic fluid. This is also known as your "water breaking." It could be a slow trickle or a gush. Let your health care provider know if it has a color or strange odor. If you have any of these signs, call your health care provider right away, even if it is before your due date. Follow these instructions at home: Medicines  Follow your health care provider's instructions regarding medicine use. Specific medicines may be either safe or unsafe to take during pregnancy.  Take a prenatal vitamin that contains at least 600 micrograms (mcg) of folic acid.  If you develop constipation, try taking a stool softener if your health care provider approves. Eating and drinking   Eat a balanced diet that includes fresh fruits and vegetables, whole grains, good sources of protein such as meat, eggs, or tofu,  and low-fat dairy. Your health care provider will help you determine the amount of weight gain that is right for you.  Avoid raw meat and uncooked cheese. These carry germs that can cause birth defects in the baby.  If you have low calcium intake from food, talk to your health care provider about whether you should take a daily calcium supplement.  Eat four or five small meals rather than three large meals a day.  Limit foods that are high in fat and processed sugars, such as fried and sweet foods.  To prevent constipation: ? Drink enough fluid to keep your urine clear or pale yellow. ? Eat foods that are high in fiber, such as fresh fruits and vegetables, whole grains, and beans. Activity  Exercise only as directed by your health care provider. Most women can continue their usual exercise routine during pregnancy. Try to exercise for 30 minutes at least 5 days a week. Stop exercising if you experience uterine contractions.  Avoid heavy lifting.  Do   not exercise in extreme heat or humidity, or at high altitudes.  Wear low-heel, comfortable shoes.  Practice good posture.  You may continue to have sex unless your health care provider tells you otherwise. Relieving pain and discomfort  Take frequent breaks and rest with your legs elevated if you have leg cramps or low back pain.  Take warm sitz baths to soothe any pain or discomfort caused by hemorrhoids. Use hemorrhoid cream if your health care provider approves.  Wear a good support bra to prevent discomfort from breast tenderness.  If you develop varicose veins: ? Wear support pantyhose or compression stockings as told by your healthcare provider. ? Elevate your feet for 15 minutes, 3-4 times a day. Prenatal care  Write down your questions. Take them to your prenatal visits.  Keep all your prenatal visits as told by your health care provider. This is important. Safety  Wear your seat belt at all times when driving.  Make  a list of emergency phone numbers, including numbers for family, friends, the hospital, and police and fire departments. General instructions  Avoid cat litter boxes and soil used by cats. These carry germs that can cause birth defects in the baby. If you have a cat, ask someone to clean the litter box for you.  Do not travel far distances unless it is absolutely necessary and only with the approval of your health care provider.  Do not use hot tubs, steam rooms, or saunas.  Do not drink alcohol.  Do not use any products that contain nicotine or tobacco, such as cigarettes and e-cigarettes. If you need help quitting, ask your health care provider.  Do not use any medicinal herbs or unprescribed drugs. These chemicals affect the formation and growth of the baby.  Do not douche or use tampons or scented sanitary pads.  Do not cross your legs for long periods of time.  To prepare for the arrival of your baby: ? Take prenatal classes to understand, practice, and ask questions about labor and delivery. ? Make a trial run to the hospital. ? Visit the hospital and tour the maternity area. ? Arrange for maternity or paternity leave through employers. ? Arrange for family and friends to take care of pets while you are in the hospital. ? Purchase a rear-facing car seat and make sure you know how to install it in your car. ? Pack your hospital bag. ? Prepare the baby's nursery. Make sure to remove all pillows and stuffed animals from the baby's crib to prevent suffocation.  Visit your dentist if you have not gone during your pregnancy. Use a soft toothbrush to brush your teeth and be gentle when you floss. Contact a health care provider if:  You are unsure if you are in labor or if your water has broken.  You become dizzy.  You have mild pelvic cramps, pelvic pressure, or nagging pain in your abdominal area.  You have lower back pain.  You have persistent nausea, vomiting, or  diarrhea.  You have an unusual or bad smelling vaginal discharge.  You have pain when you urinate. Get help right away if:  Your water breaks before 37 weeks.  You have regular contractions less than 5 minutes apart before 37 weeks.  You have a fever.  You are leaking fluid from your vagina.  You have spotting or bleeding from your vagina.  You have severe abdominal pain or cramping.  You have rapid weight loss or weight gain.  You have   shortness of breath with chest pain.  You notice sudden or extreme swelling of your face, hands, ankles, feet, or legs.  Your baby makes fewer than 10 movements in 2 hours.  You have severe headaches that do not go away when you take medicine.  You have vision changes. Summary  The third trimester is from week 28 through week 40, months 7 through 9. The third trimester is a time when the unborn baby (fetus) is growing rapidly.  During the third trimester, your discomfort may increase as you and your baby continue to gain weight. You may have abdominal, leg, and back pain, sleeping problems, and an increased need to urinate.  During the third trimester your breasts will keep growing and they will continue to become tender. A yellow fluid (colostrum) may leak from your breasts. This is the first milk you are producing for your baby.  False labor is a condition in which you feel small, irregular tightenings of the muscles in the womb (contractions) that eventually go away. These are called Braxton Hicks contractions. Contractions may last for hours, days, or even weeks before true labor sets in.  Signs of labor can include: abdominal cramps; regular contractions that start at 10 minutes apart and become stronger and more frequent with time; watery or bloody mucus discharge that comes from the vagina; increased pelvic pressure and dull back pain; and leaking of amniotic fluid. This information is not intended to replace advice given to you by your  health care provider. Make sure you discuss any questions you have with your health care provider. Document Revised: 05/25/2018 Document Reviewed: 03/09/2016 Elsevier Patient Education  2020 Elsevier Inc.  

## 2019-10-01 NOTE — Patient Instructions (Signed)
Alleviate symptoms of anxiety:   Mindfulness Techniques (for added guidance there are plenty of mindfulness techniques videos on youtube.   Prenatal yoga   Prioritize rest and sleep   Engage in stress reducing and self care activities such as walking, favorite hobbies, and light and safe physical activities.

## 2019-10-03 ENCOUNTER — Encounter: Payer: 59 | Admitting: Nurse Practitioner

## 2019-10-24 ENCOUNTER — Other Ambulatory Visit: Payer: Self-pay

## 2019-10-24 ENCOUNTER — Other Ambulatory Visit (HOSPITAL_COMMUNITY)
Admission: RE | Admit: 2019-10-24 | Discharge: 2019-10-24 | Disposition: A | Discharge status: Home / Self Care | Payer: 59 | Source: Ambulatory Visit | Attending: Advanced Practice Midwife | Admitting: Advanced Practice Midwife

## 2019-10-24 ENCOUNTER — Encounter: Payer: Self-pay | Admitting: Advanced Practice Midwife

## 2019-10-24 ENCOUNTER — Ambulatory Visit (INDEPENDENT_AMBULATORY_CARE_PROVIDER_SITE_OTHER): Payer: 59 | Admitting: Advanced Practice Midwife

## 2019-10-24 VITALS — BP 120/80 | HR 94 | Wt 194.0 lb

## 2019-10-24 DIAGNOSIS — Z3480 Encounter for supervision of other normal pregnancy, unspecified trimester: Secondary | ICD-10-CM | POA: Insufficient documentation

## 2019-10-24 DIAGNOSIS — Z23 Encounter for immunization: Secondary | ICD-10-CM

## 2019-10-24 DIAGNOSIS — N9489 Other specified conditions associated with female genital organs and menstrual cycle: Secondary | ICD-10-CM

## 2019-10-24 DIAGNOSIS — Z3A36 36 weeks gestation of pregnancy: Secondary | ICD-10-CM

## 2019-10-24 DIAGNOSIS — O210 Mild hyperemesis gravidarum: Secondary | ICD-10-CM

## 2019-10-24 MED ORDER — PANTOPRAZOLE SODIUM 40 MG PO TBEC: 40.0000 mg | DELAYED_RELEASE_TABLET | Freq: Every day | ORAL | 1 refills | Status: DC

## 2019-10-24 NOTE — Progress Notes (Signed)
° °  PRENATAL VISIT NOTE  Subjective:  Jennifer Parker is a 29 y.o. G1P0 at [redacted]w[redacted]d being seen today for ongoing prenatal care.  She is currently monitored for the following issues for this low-risk pregnancy and has Major depressive disorder, recurrent episode with seasonal pattern (HCC); Supervision of other normal pregnancy, antepartum; Rubella non-immune status, antepartum; and Rh negative state in antepartum period on their problem list.  Patient reports occasional contractions and mild swelling of labia, causes pain at night.  Contractions: Irregular. Vag. Bleeding: None.  Movement: Present. Denies leaking of fluid.   The following portions of the patient's history were reviewed and updated as appropriate: allergies, current medications, past family history, past medical history, past social history, past surgical history and problem list.   Objective:   Vitals:   10/24/19 0839  BP: 120/80  Pulse: 94  Weight: 194 lb (88 kg)    Fetal Status: Fetal Heart Rate (bpm): 150 Fundal Height: 35 cm Movement: Present     General:  Alert, oriented and cooperative. Patient is in no acute distress.  Skin: Skin is warm and dry. No rash noted.   Cardiovascular: Normal heart rate noted  Respiratory: Normal respiratory effort, no problems with respiration noted  Abdomen: Soft, gravid, appropriate for gestational age.  Pain/Pressure: Present     Pelvic: Cervical exam deferred        Extremities: Normal range of motion.     Mental Status: Normal mood and affect. Normal behavior. Normal judgment and thought content.   Assessment and Plan:  Pregnancy: G1P0 at [redacted]w[redacted]d 1. Supervision of other normal pregnancy, antepartum --Anticipatory guidance about next visits/weeks of pregnancy given. --Reviewed pt birth plan and packing list for hospital stay. --Pt attended waterbirth class and will send or bring certificate to next visit --Waterbirth consent signed and witnessed today --Pt desires water immersion  for labor but not sure if she wants to deliver in the tub. --Reviewed contraindications, especially things that an change immediately before or during hospital admission like elevated blood pressure and vaginal bleeding.  Pt states understanding.   --Labor readiness reviewed including EPO, raspberry tea, and the [employer] positions. - Cervicovaginal ancillary only( [place]) - Strep Gp B NAA  2. [redacted] weeks gestation of pregnancy   3. Labial swelling --Recommend vaginal support, position changes, ice for comfort, using more pillows for comfortable sleep.  Preterm labor symptoms and general obstetric precautions including but not limited to vaginal bleeding, contractions, leaking of fluid and fetal movement were reviewed in detail with the patient. Please refer to After Visit Summary for other counseling recommendations.   Return in about 2 weeks (around 11/07/2019).  Future Appointments  Date Time Provider Department Center  10/29/2019 10:30 AM Gwyndolyn Saxon, LCSW [facility] None  11/07/2019  8:55 AM Nugent, Odie Sera, NP [facility] None    Sharen Counter, CNM

## 2019-10-24 NOTE — Progress Notes (Signed)
Pt presents for ROB/GBS/GC/CT Flu vaccine given LD without difficulty

## 2019-10-24 NOTE — Patient Instructions (Signed)
Things to Try After 37 weeks to Encourage Labor/Get Ready for Labor:   1.  Try the Miles Circuit at www.milescircuit.com daily to improve baby's position and encourage the onset of labor.  2. Walk a little and rest a little every day.  Change positions often.  3. Cervical Ripening: May try one or both a. Red Raspberry Leaf capsules or tea:  two 300mg or 400mg tablets with each meal, 2-3 times a day, or 1-3 cups of tea daily  Potential Side Effects Of Raspberry Leaf:  Most women do not experience any side effects from drinking raspberry leaf tea. However, nausea and loose stools are possible   b. Evening Primrose Oil capsules: may take 1 to 3 capsules daily. Take 1-2 capsules by mouth each day and place one capsule vaginally at night.  You may also prick the vaginal capsule to release the oil prior to inserting in the vagina. Some of the potential side effects:  Upset stomach  Loose stools or diarrhea  Headaches  Nausea  4. Sex (and especially sex with orgasm) can also help the cervix ripen and encourage labor onset.  Labor Precautions Reasons to come to [facility] at [facility]:  1.  Contractions are  5 minutes apart or less, each last 1 minute, these have been going on for 1-2 hours, and you cannot walk or talk during them 2.  You have a large gush of fluid, or a trickle of fluid that will not stop and you have to wear a pad 3.  You have bleeding that is bright red, heavier than spotting--like menstrual bleeding (spotting can be normal in early labor or after a check of your cervix) 4.  You do not feel the baby moving like he/she normally does 

## 2019-10-25 LAB — CERVICOVAGINAL ANCILLARY ONLY
Chlamydia: NEGATIVE
Comment: NEGATIVE
Comment: NORMAL
Neisseria Gonorrhea: NEGATIVE

## 2019-10-26 LAB — STREP GP B NAA: Strep Gp B NAA: NEGATIVE

## 2019-10-29 ENCOUNTER — Ambulatory Visit (INDEPENDENT_AMBULATORY_CARE_PROVIDER_SITE_OTHER): Payer: 59 | Admitting: Licensed Clinical Social Worker

## 2019-10-29 DIAGNOSIS — F419 Anxiety disorder, unspecified: Secondary | ICD-10-CM

## 2019-10-29 DIAGNOSIS — O99340 Other mental disorders complicating pregnancy, unspecified trimester: Secondary | ICD-10-CM

## 2019-10-29 NOTE — BH Specialist Note (Signed)
Integrated Behavioral Health Follow Up Visit  MRN: [id number] Name: Jennifer Parker  Number of Integrated Behavioral Health Clinician visits: 2 Session Start time: 10:40am  Session End time: 11:06am Total time: 26 mins via mychart   Type of Service: Integrated Behavioral Health- Individual/Family Interpretor:no  Interpretor Name and Language: none  SUBJECTIVE: Jennifer Parker is a 29 y.o. female accompanied by n/a Patient was referred by Courtney Paris for at risk for postpartum depression. Patient reports the following symptoms/concerns: Loss of interest and fatigue  Duration of problem: ; Severity of problem: mild  OBJECTIVE: Mood: good and Affect: normal  Risk of harm to self or others: No risk of harm to self or others.   LIFE CONTEXT: Family and Social: Lives with spouse in [place] School/Work: Speech Therapist  Self-Care: Creating time in schedule for rest  Life Changes: Relocating in March to [place]  GOALS ADDRESSED: Patient will: 1.  Reduce symptoms of:   2.  Increase knowledge and/or ability of:   3.  Demonstrate ability to: self manage symptoms   INTERVENTIONS: Interventions utilized:  Supportive counseling  Standardized Assessments completed:    Integrated Behavioral Health from 10/01/2019 in [facility]  PHQ-9 Total Score 10      ASSESSMENT: Patient currently experiencing anxiety affecting pregnancy. Ms. Val reports improvement in mood since implementing coping skills discussed during last appt. According to Ms. Brossart, she is communicating needs to spouse, creating time for self care and prioritizing sleep.   Patient may benefit from integrated behavioral health   PLAN: 1. Follow up with behavioral health clinician on : as needed  2. Behavioral recommendations: Prioritize sleep, prenatal yoga, mindfulness techniques, pre register with hospital this week.  3. Referral(s): Medicaid rep at hospital  4. "From scale  of 1-10, how likely are you to follow plan?":   Gwyndolyn Saxon, LCSW

## 2019-11-07 ENCOUNTER — Ambulatory Visit (INDEPENDENT_AMBULATORY_CARE_PROVIDER_SITE_OTHER): Payer: 59 | Admitting: Women's Health

## 2019-11-07 ENCOUNTER — Other Ambulatory Visit: Payer: Self-pay

## 2019-11-07 VITALS — BP 120/83 | HR 84 | Wt 196.0 lb

## 2019-11-07 DIAGNOSIS — Z6791 Unspecified blood type, Rh negative: Secondary | ICD-10-CM

## 2019-11-07 DIAGNOSIS — Z283 Underimmunization status: Secondary | ICD-10-CM

## 2019-11-07 DIAGNOSIS — O99891 Other specified diseases and conditions complicating pregnancy: Secondary | ICD-10-CM

## 2019-11-07 DIAGNOSIS — Z3480 Encounter for supervision of other normal pregnancy, unspecified trimester: Secondary | ICD-10-CM

## 2019-11-07 DIAGNOSIS — Z3A38 38 weeks gestation of pregnancy: Secondary | ICD-10-CM

## 2019-11-07 DIAGNOSIS — O26899 Other specified pregnancy related conditions, unspecified trimester: Secondary | ICD-10-CM

## 2019-11-07 DIAGNOSIS — F339 Major depressive disorder, recurrent, unspecified: Secondary | ICD-10-CM

## 2019-11-07 DIAGNOSIS — Z2839 Other underimmunization status: Secondary | ICD-10-CM

## 2019-11-07 NOTE — Progress Notes (Signed)
Subjective:  Jendayi Berling is a 29 y.o. G1P0 at [redacted]w[redacted]d being seen today for ongoing prenatal care.  She is currently monitored for the following issues for this low-risk pregnancy and has Major depressive disorder, recurrent episode with seasonal pattern (HCC); Supervision of other normal pregnancy, antepartum; Rubella non-immune status, antepartum; and Rh negative state in antepartum period on their problem list.  Patient reports no complaints.  Contractions: Irregular. Vag. Bleeding: None.  Movement: Present. Denies leaking of fluid.   The following portions of the patient's history were reviewed and updated as appropriate: allergies, current medications, past family history, past medical history, past social history, past surgical history and problem list. Problem list updated.  Objective:   Vitals:   11/07/19 0859  BP: 120/83  Pulse: 84  Weight: 196 lb (88.9 kg)    Fetal Status: Fetal Heart Rate (bpm): 136   Movement: Present     General:  Alert, oriented and cooperative. Patient is in no acute distress.  Skin: Skin is warm and dry. No rash noted.   Cardiovascular: Normal heart rate noted  Respiratory: Normal respiratory effort, no problems with respiration noted  Abdomen: Soft, gravid, appropriate for gestational age. Pain/Pressure: Present     Pelvic: Vag. Bleeding: None     Cervical exam deferred        Extremities: Normal range of motion.  Edema: None  Mental Status: Normal mood and affect. Normal behavior. Normal judgment and thought content.   Urinalysis:      Assessment and Plan:  Pregnancy: G1P0 at [redacted]w[redacted]d  1. Supervision of other normal pregnancy, antepartum -peds list given -waterbirth certificate presented in office today  2. Rh negative state in antepartum period  3. Rubella non-immune status, antepartum -vaccination ppartum  4. Major depressive disorder, recurrent episode with seasonal pattern (HCC) -pt connected to [facility], last visit 10/29/2019  Term labor  symptoms and general obstetric precautions including but not limited to vaginal bleeding, contractions, leaking of fluid and fetal movement were reviewed in detail with the patient. I discussed the assessment and treatment plan with the patient. The patient was provided an opportunity to ask questions and all were answered. The patient agreed with the plan and demonstrated an understanding of the instructions. The patient was advised to call back or seek an in-person office evaluation/go to MAU at [facility] for any urgent or concerning symptoms. Please refer to After Visit Summary for other counseling recommendations.  Return in about 1 week (around 11/14/2019) for virtual LOB/APP OK.   Basha Krygier, Odie Sera, NP

## 2019-11-07 NOTE — Progress Notes (Signed)
ROB reports no problems today. 

## 2019-11-07 NOTE — Patient Instructions (Signed)
Maternity Assessment Unit (MAU) ° °The Maternity Assessment Unit (MAU) is located at the [facility] at [facility]. The address is: [address], [address], [place], [place] [address]. Please see map below for additional directions. ° ° ° °The Maternity Assessment Unit is designed to help you during your pregnancy, and for up to 6 weeks after delivery, with any pregnancy- or postpartum-related emergencies, if you think you are in labor, or if your water has broken. For example, if you experience nausea and vomiting, vaginal bleeding, severe abdominal or pelvic pain, elevated blood pressure or other problems related to your pregnancy or postpartum time, please come to the Maternity Assessment Unit for assistance. ° ° ° ° ° ° °AREA PEDIATRIC/FAMILY PRACTICE PHYSICIANS ° °[facility] °[address] °[address] °[place], [place] [address] °Phone - [phone]   Fax - [phone] ° °[facility] °[address] °[place], [place]  [address] °Phone - [phone]   Fax - [phone] ° °[facility] °[address] °[place], [place]  [address] °Phone - [phone]   Fax - [phone] ° °[facility] °[address] °[place], [place]  [address] °Phone - [phone]   Fax - [phone] ° °[facility] °[address] °[place], [place]  [address] °Phone - [phone]   Fax - [phone] ° °[facility] °[address] °[place], [place]  [address] °Phone - [phone]   Fax - [phone] ° °[facility] °[address] °[place], [place]  [address] °Phone - [phone]   Fax - [phone] ° °[facility] °[address] °[place], [place]  [address] °Phone - [phone]   Fax - [phone] ° °[facility] °[address] °[place], [place]  [address] °Phone - [phone]   Fax -  [phone] °[facility] °[address] °[place], [place]  [address] °Phone - [phone]   Fax - [phone] ° °[facility] °[address] °[place], [place]  [address] °Phone - [phone]   Fax - [phone] ° °[facility] °[address] °[place], [place]  [address] °Phone - [phone]   Fax - [phone] ° °[facility] °[address] °[place], [place]  [address] °Phone - [phone]   Fax - [phone] ° °[facility] °[address] °[place], [place]  [address] °Phone - [phone] ° °[facility] °[address] °[place], [place]  [address] °Phone - [phone]   Fax - [phone] ° °[facility] °[address] °[place], [place]  [address] °Phone - [phone]   Fax - [phone] ° °[facility] °[address] °[place], [place]  [address] °Phone - [phone]   Fax - [phone] ° °[facility] °[address] °[place], [place]  [address] °Phone - [phone]   Fax - [phone] ° ° °[facility] °[address] °[place], [place]  [address] °Phone - [phone]   Fax - [phone] ° °[facility] °[address] °, [place]  [address] °Phone - [phone]   Fax - [phone] ° °[facility] °[address] °, [place]  [address] °Phone - [phone]   Fax - [phone] ° °[facility] °[address] °, [place]  [address] °Phone - [phone]     Fax - [phone]  [facility] [address] [place], [place]  [address] Phone - [phone]   Fax - [phone] Gerarda Fraction [phone] [place], [place]  [address] Phone - [phone]   Fax - [phone]  [facility] [address] [place], [place]  [address] Phone - [phone]   Fax - [phone]  [facility]  [facility] - [place] [address] [place], [place]  [address] Phone - [phone]   Fax - [phone]          Signs and Symptoms of Labor Labor is your body's natural process of moving your baby, placenta, and umbilical cord out of your uterus. The process of labor usually starts when your baby is full-term, between 33 and 40 weeks of pregnancy. How will I know when I am close to going into labor? As your body prepares for labor and the birth of your baby, you may notice the following symptoms in the weeks and days before true labor starts:  Having a strong desire to get your home ready to receive your new baby. This is called nesting. Nesting may be a sign that labor is approaching, and it may occur several weeks before birth. Nesting may involve cleaning and organizing your home.  Passing a small amount of thick, bloody mucus out of your vagina (normal bloody show or losing your mucus plug). This may happen more than a week before labor begins, or it might occur right before labor begins as the opening of the cervix starts to widen (dilate). For some women, the entire mucus plug passes at once. For others, smaller portions of the mucus plug may gradually pass over several days.  Your baby moving (dropping) lower in your pelvis to get into position for birth (lightening). When this happens, you may feel more pressure on your bladder and pelvic bone and less pressure on your ribs. This may make it easier to breathe. It may also cause you to need to urinate more often and have problems with bowel movements.  Having "practice contractions" (Braxton Hicks contractions) that occur at irregular (unevenly spaced) intervals that are more than 10 minutes apart. This is also called false labor. False labor contractions are common after exercise or sexual activity, and they will stop if you change position, rest, or drink fluids. These contractions are usually mild and  do not get stronger over time. They may feel like: ? A backache or back pain. ? Mild cramps, similar to menstrual cramps. ? Tightening or pressure in your abdomen. Other early symptoms that labor may be starting soon include:  Nausea or loss of appetite.  Diarrhea.  Having a sudden burst of energy, or feeling very tired.  Mood changes.  Having trouble sleeping. How will I know when labor has begun? Signs that true labor has begun may include:  Having contractions that come at regular (evenly spaced) intervals and increase in intensity. This may feel like more intense tightening or pressure in your abdomen that moves to your back. ? Contractions may also feel like rhythmic pain in your upper thighs or back that comes and goes at regular intervals. ? For first-time mothers, this change in intensity of contractions often occurs at a more gradual pace. ? Women who have given birth before may notice a more rapid progression of contraction changes.  Having a feeling of pressure in the vaginal area.  Your water breaking (rupture of membranes). This is when the  sac of fluid that surrounds your baby breaks. When this happens, you will notice fluid leaking from your vagina. This may be clear or blood-tinged. Labor usually starts within 24 hours of your water breaking, but it may take longer to begin. ? Some women notice this as a gush of fluid. ? Others notice that their underwear repeatedly becomes damp. Follow these instructions at home:   When labor starts, or if your water breaks, call your health care provider or nurse care line. Based on your situation, they will determine when you should go in for an exam.  When you are in early labor, you may be able to rest and manage symptoms at home. Some strategies to try at home include: ? Breathing and relaxation techniques. ? Taking a warm bath or shower. ? Listening to music. ? Using a heating pad on the lower back for pain. If you are  directed to use heat:  Place a towel between your skin and the heat source.  Leave the heat on for 20-30 minutes.  Remove the heat if your skin turns bright red. This is especially important if you are unable to feel pain, heat, or cold. You may have a greater risk of getting burned. Get help right away if:  You have painful, regular contractions that are 5 minutes apart or less.  Labor starts before you are [redacted] weeks along in your pregnancy.  You have a fever.  You have a headache that does not go away.  You have bright red blood coming from your vagina.  You do not feel your baby moving.  You have a sudden onset of: ? Severe headache with vision problems. ? Nausea, vomiting, or diarrhea. ? Chest pain or shortness of breath. These symptoms may be an emergency. If your health care provider recommends that you go to the hospital or birth center where you plan to deliver, do not drive yourself. Have someone else drive you, or call emergency services (911 in the [place] Summary  Labor is your body's natural process of moving your baby, placenta, and umbilical cord out of your uterus.  The process of labor usually starts when your baby is full-term, between 58 and 40 weeks of pregnancy.  When labor starts, or if your water breaks, call your health care provider or nurse care line. Based on your situation, they will determine when you should go in for an exam. This information is not intended to replace advice given to you by your health care provider. Make sure you discuss any questions you have with your health care provider. Document Revised: 11/01/2016 Document Reviewed: 07/09/2016 Elsevier Patient Education  2020 [employer].

## 2019-11-14 ENCOUNTER — Telehealth (INDEPENDENT_AMBULATORY_CARE_PROVIDER_SITE_OTHER): Payer: 59 | Admitting: Obstetrics and Gynecology

## 2019-11-14 VITALS — BP 115/85 | HR 97

## 2019-11-14 DIAGNOSIS — O361930 Maternal care for other isoimmunization, third trimester, not applicable or unspecified: Secondary | ICD-10-CM

## 2019-11-14 DIAGNOSIS — F339 Major depressive disorder, recurrent, unspecified: Secondary | ICD-10-CM

## 2019-11-14 DIAGNOSIS — Z283 Underimmunization status: Secondary | ICD-10-CM

## 2019-11-14 DIAGNOSIS — O09899 Supervision of other high risk pregnancies, unspecified trimester: Secondary | ICD-10-CM

## 2019-11-14 DIAGNOSIS — Z6791 Unspecified blood type, Rh negative: Secondary | ICD-10-CM

## 2019-11-14 DIAGNOSIS — Z3A39 39 weeks gestation of pregnancy: Secondary | ICD-10-CM

## 2019-11-14 DIAGNOSIS — O99343 Other mental disorders complicating pregnancy, third trimester: Secondary | ICD-10-CM

## 2019-11-14 DIAGNOSIS — Z3480 Encounter for supervision of other normal pregnancy, unspecified trimester: Secondary | ICD-10-CM

## 2019-11-14 NOTE — Progress Notes (Signed)
   OBSTETRICS PRENATAL VIRTUAL VISIT ENCOUNTER NOTE  Provider location: Center for [facility]   I connected with Jennifer Parker on 11/14/19 at  1:15 PM EDT by MyChart Video Encounter at home and verified that I am speaking with the correct person using two identifiers.   I discussed the limitations, risks, security and privacy concerns of performing an evaluation and management service virtually and the availability of in person appointments. I also discussed with the patient that there may be a patient responsible charge related to this service. The patient expressed understanding and agreed to proceed. Subjective:  Jennifer Parker is a 29 y.o. G1P0 at [redacted]w[redacted]d being seen today for ongoing prenatal care.  She is currently monitored for the following issues for this low-risk pregnancy and has Major depressive disorder, recurrent episode with seasonal pattern (HCC); Supervision of other normal pregnancy, antepartum; Rubella non-immune status, antepartum; and Rh negative state in antepartum period on their problem list.  Patient reports seasonal headache.  Contractions: Irregular. Vag. Bleeding: None.  Movement: Present. Denies any leaking of fluid.   The following portions of the patient's history were reviewed and updated as appropriate: allergies, current medications, past family history, past medical history, past social history, past surgical history and problem list.   Objective:   Vitals:   11/14/19 1320  BP: 115/85  Pulse: 97    Fetal Status:     Movement: Present     General:  Alert, oriented and cooperative. Patient is in no acute distress.  Respiratory: Normal respiratory effort, no problems with respiration noted  Mental Status: Normal mood and affect. Normal behavior. Normal judgment and thought content.  Rest of physical exam deferred due to type of encounter  Imaging: No results found.  Assessment and Plan:  Pregnancy: G1P0 at [redacted]w[redacted]d 1. Rubella  non-immune status, antepartum   2. Supervision of other normal pregnancy, antepartum In person visit in 1 week with cervical exam, NST/AFI also scheduled Will schedule for IOL at 41 weeks  3. Rh negative state in antepartum period Rhogam after delivery  Term labor symptoms and general obstetric precautions including but not limited to vaginal bleeding, contractions, leaking of fluid and fetal movement were reviewed in detail with the patient. I discussed the assessment and treatment plan with the patient. The patient was provided an opportunity to ask questions and all were answered. The patient agreed with the plan and demonstrated an understanding of the instructions. The patient was advised to call back or seek an in-person office evaluation/go to MAU at [facility] for any urgent or concerning symptoms. Please refer to After Visit Summary for other counseling recommendations.   I provided 10 minutes of face-to-face time during this encounter.  Return in about 1 week (around 11/21/2019) for ROB, in person.  No future appointments.  Warden Fillers, MD Center for [employer], [facility]

## 2019-11-14 NOTE — Progress Notes (Signed)
S/w pt for virtual visit. Pt reports good fetal movement with irregular contractions.

## 2019-11-14 NOTE — Patient Instructions (Signed)
Third Trimester of Pregnancy  The third trimester is from week 28 through week 40 (months 7 through 9). This trimester is when your unborn baby (fetus) is growing very fast. At the end of the ninth month, the unborn baby is about 20 inches in length. It weighs about 6-10 pounds. Follow these instructions at home: Medicines  Take over-the-counter and prescription medicines only as told by your doctor. Some medicines are safe and some medicines are not safe during pregnancy.  Take a prenatal vitamin that contains at least 600 micrograms (mcg) of folic acid.  If you have trouble pooping (constipation), take medicine that will make your stool soft (stool softener) if your doctor approves. Eating and drinking   Eat regular, healthy meals.  Avoid raw meat and uncooked cheese.  If you get low calcium from the food you eat, talk to your doctor about taking a daily calcium supplement.  Eat four or five small meals rather than three large meals a day.  Avoid foods that are high in fat and sugars, such as fried and sweet foods.  To prevent constipation: ? Eat foods that are high in fiber, like fresh fruits and vegetables, whole grains, and beans. ? Drink enough fluids to keep your pee (urine) clear or pale yellow. Activity  Exercise only as told by your doctor. Stop exercising if you start to have cramps.  Avoid heavy lifting, wear low heels, and sit up straight.  Do not exercise if it is too hot, too humid, or if you are in a place of great height (high altitude).  You may continue to have sex unless your doctor tells you not to. Relieving pain and discomfort  Wear a good support bra if your breasts are tender.  Take frequent breaks and rest with your legs raised if you have leg cramps or low back pain.  Take warm water baths (sitz baths) to soothe pain or discomfort caused by hemorrhoids. Use hemorrhoid cream if your doctor approves.  If you develop puffy, bulging veins (varicose  veins) in your legs: ? Wear support hose or compression stockings as told by your doctor. ? Raise (elevate) your feet for 15 minutes, 3-4 times a day. ? Limit salt in your food. Safety  Wear your seat belt when driving.  Make a list of emergency phone numbers, including numbers for family, friends, the hospital, and police and fire departments. Preparing for your baby's arrival To prepare for the arrival of your baby:  Take prenatal classes.  Practice driving to the hospital.  Visit the hospital and tour the maternity area.  Talk to your work about taking leave once the baby comes.  Pack your hospital bag.  Prepare the baby's room.  Go to your doctor visits.  Buy a rear-facing car seat. Learn how to install it in your car. General instructions  Do not use hot tubs, steam rooms, or saunas.  Do not use any products that contain nicotine or tobacco, such as cigarettes and e-cigarettes. If you need help quitting, ask your doctor.  Do not drink alcohol.  Do not douche or use tampons or scented sanitary pads.  Do not cross your legs for long periods of time.  Do not travel for long distances unless you must. Only do so if your doctor says it is okay.  Visit your dentist if you have not gone during your pregnancy. Use a soft toothbrush to brush your teeth. Be gentle when you floss.  Avoid cat litter boxes and soil   used by cats. These carry germs that can cause birth defects in the baby and can cause a loss of your baby (miscarriage) or stillbirth.  Keep all your prenatal visits as told by your doctor. This is important. Contact a doctor if:  You are not sure if you are in labor or if your water has broken.  You are dizzy.  You have mild cramps or pressure in your lower belly.  You have a nagging pain in your belly area.  You continue to feel sick to your stomach, you throw up, or you have watery poop.  You have bad smelling fluid coming from your vagina.  You have  pain when you pee. Get help right away if:  You have a fever.  You are leaking fluid from your vagina.  You are spotting or bleeding from your vagina.  You have severe belly cramps or pain.  You lose or gain weight quickly.  You have trouble catching your breath and have chest pain.  You notice sudden or extreme puffiness (swelling) of your face, hands, ankles, feet, or legs.  You have not felt the baby move in over an hour.  You have severe headaches that do not go away with medicine.  You have trouble seeing.  You are leaking, or you are having a gush of fluid, from your vagina before you are 37 weeks.  You have regular belly spasms (contractions) before you are 37 weeks. Summary  The third trimester is from week 28 through week 40 (months 7 through 9). This time is when your unborn baby is growing very fast.  Follow your doctor's advice about medicine, food, and activity.  Get ready for the arrival of your baby by taking prenatal classes, getting all the baby items ready, preparing the baby's room, and visiting your doctor to be checked.  Get help right away if you are bleeding from your vagina, or you have chest pain and trouble catching your breath, or if you have not felt your baby move in over an hour. This information is not intended to replace advice given to you by your health care provider. Make sure you discuss any questions you have with your health care provider. Document Revised: 05/25/2018 Document Reviewed: 03/09/2016 Elsevier Patient Education  2020 [employer] Nonstress Test A nonstress test is a procedure that is done during pregnancy in order to check the baby's heartbeat. The procedure can help show if the baby (fetus) is healthy. It is commonly done when:  The baby is past his or her due date.  The pregnancy is high risk.  The baby is moving less than normal.  The mother has lost a pregnancy in the past.  The health care provider suspects a  problem with the baby's growth.  There is too much or too little amniotic fluid. The procedure is often done in the third trimester of pregnancy to find out if an early delivery is needed and whether such a delivery is safe. During a nonstress test, the baby's heartbeat is monitored when the baby is resting and when the baby is moving. If the baby is healthy, the heart rate will increase when he or she moves or kicks and will return to normal when he or she rests. Tell a health care provider about:  Any allergies you have.  Any medical conditions you have.  All medicines you are taking, including vitamins, herbs, eye drops, creams, and over-the-counter medicines. What are the risks? There are no risks  to you or your baby from a nonstress test. This procedure should not be painful or uncomfortable. What happens before the procedure?  Eat a meal right before the test or as directed by your health care provider. Food may help encourage the baby to move.  Use the restroom right before the test. What happens during the procedure?  Two monitors will be placed on your abdomen. One will record the baby's heart rate and the other will record the contractions of your uterus.  You may be asked to lie down on your side or to sit upright.  You may be given a button to press when you feel your baby move.  Your health care provider will listen to your baby's heartbeat and recorded it. He or she may also watch your baby's heartbeat on a screen.  If the baby seems to be sleeping, you may be asked to drink some juice or soda, eat a snack, or change positions. The procedure may vary among health care providers and hospitals. What happens after the procedure?  Your health care provider will discuss the test results with you and make recommendations for the future. Depending on the results, your health care provider may order additional tests or another course of action.  If your health care provider  gave you any diet or activity instructions, make sure to follow them.  Keep all follow-up visits as told by your health care provider. This is important. Summary  A nonstress test is a procedure that is done during pregnancy in order to check the baby's heartbeat. The procedure can help show if the baby is healthy.  The procedure is often done in the third trimester of pregnancy to find out if an early delivery is needed and whether such a delivery is safe.  During a nonstress test, the baby's heartbeat is monitored when the baby is resting and when the baby is moving. If the baby is healthy, the heart rate will increase when he or she moves or kicks and will return to normal when he or she rests.  Your health care provider will discuss the test results with you and make recommendations for the future. This information is not intended to replace advice given to you by your health care provider. Make sure you discuss any questions you have with your health care provider. Document Revised: 05/13/2016 Document Reviewed: 05/13/2016 Elsevier Patient Education  2020 [employer].

## 2019-11-15 NOTE — Addendum Note (Signed)
Addended by: Warden Fillers on: 11/15/2019 12:36 PM   Modules accepted: Orders, SmartSet

## 2019-11-19 ENCOUNTER — Telehealth (HOSPITAL_COMMUNITY): Payer: Self-pay | Admitting: *Deleted

## 2019-11-19 NOTE — Telephone Encounter (Signed)
Preadmission screen  

## 2019-11-20 ENCOUNTER — Telehealth (HOSPITAL_COMMUNITY): Payer: Self-pay | Admitting: *Deleted

## 2019-11-20 ENCOUNTER — Encounter (HOSPITAL_COMMUNITY): Payer: Self-pay | Admitting: *Deleted

## 2019-11-20 ENCOUNTER — Ambulatory Visit (INDEPENDENT_AMBULATORY_CARE_PROVIDER_SITE_OTHER): Payer: 59 | Admitting: Advanced Practice Midwife

## 2019-11-20 ENCOUNTER — Other Ambulatory Visit: Payer: Self-pay

## 2019-11-20 ENCOUNTER — Ambulatory Visit (INDEPENDENT_AMBULATORY_CARE_PROVIDER_SITE_OTHER): Payer: 59

## 2019-11-20 ENCOUNTER — Other Ambulatory Visit: Payer: Self-pay | Admitting: Advanced Practice Midwife

## 2019-11-20 ENCOUNTER — Encounter: Payer: Self-pay | Admitting: Advanced Practice Midwife

## 2019-11-20 VITALS — BP 120/80 | HR 77 | Wt 197.0 lb

## 2019-11-20 DIAGNOSIS — Z3480 Encounter for supervision of other normal pregnancy, unspecified trimester: Secondary | ICD-10-CM

## 2019-11-20 DIAGNOSIS — O480 Post-term pregnancy: Secondary | ICD-10-CM | POA: Diagnosis not present

## 2019-11-20 DIAGNOSIS — Z3A40 40 weeks gestation of pregnancy: Secondary | ICD-10-CM | POA: Diagnosis not present

## 2019-11-20 NOTE — Telephone Encounter (Signed)
Preadmission screen  

## 2019-11-20 NOTE — Progress Notes (Signed)
   PRENATAL VISIT NOTE  Subjective:  Jennifer Parker is a 29 y.o. G1P0 at [redacted]w[redacted]d being seen today for ongoing prenatal care.  She is currently monitored for the following issues for this low-risk pregnancy and has Major depressive disorder, recurrent episode with seasonal pattern (HCC); Supervision of other normal pregnancy, antepartum; Rubella non-immune status, antepartum; and Rh negative state in antepartum period on their problem list.  Patient reports occasional contractions.  Contractions: Irritability. Vag. Bleeding: None.  Movement: Present. Denies leaking of fluid.   The following portions of the patient's history were reviewed and updated as appropriate: allergies, current medications, past family history, past medical history, past social history, past surgical history and problem list.   Objective:   Vitals:   11/20/19 0928  BP: 120/80  Pulse: 77  Weight: 197 lb (89.4 kg)    Fetal Status:     Movement: Present     General:  Alert, oriented and cooperative. Patient is in no acute distress.  Skin: Skin is warm and dry. No rash noted.   Cardiovascular: Normal heart rate noted  Respiratory: Normal respiratory effort, no problems with respiration noted  Abdomen: Soft, gravid, appropriate for gestational age.  Pain/Pressure: Present     Pelvic: Cervical exam performed in the presence of a chaperone        Extremities: Normal range of motion.  Edema: Trace  Mental Status: Normal mood and affect. Normal behavior. Normal judgment and thought content.   Assessment and Plan:  Pregnancy: G1P0 at [redacted]w[redacted]d 1. Post-term pregnancy, 40-42 weeks of gestation --NST reactive, AFI wnl today --Pt with normal fetal movement - US OB Limited; Future - Fetal nonstress test  2. Supervision of other normal pregnancy, antepartum --Anticipatory guidance about next visits/weeks of pregnancy given. --Cervical exam and membranes swept at pt request --IOL scheduled on 10/10 at 41 weeks --Labor  readiness, labor precautions reviewed  Term labor symptoms and general obstetric precautions including but not limited to vaginal bleeding, contractions, leaking of fluid and fetal movement were reviewed in detail with the patient. Please refer to After Visit Summary for other counseling recommendations.   No follow-ups on file.  Future Appointments  Date Time Provider Department Center  11/24/2019 10:00 AM [facility]-SCREENING [facility] None  11/26/2019  9:10 AM [facility]-LD SCHED ROOM [facility] None    Sharen Counter, CNM

## 2019-11-20 NOTE — Progress Notes (Signed)
ROB   NST/AFI   CC: None

## 2019-11-20 NOTE — Progress Notes (Signed)
AFI 12.87  NST 130 baseline, 15x15, no decels.  Reactive

## 2019-11-21 ENCOUNTER — Encounter: Payer: 59 | Admitting: Obstetrics & Gynecology

## 2019-11-24 ENCOUNTER — Other Ambulatory Visit (HOSPITAL_COMMUNITY)
Admission: RE | Admit: 2019-11-24 | Discharge: 2019-11-24 | Disposition: A | Discharge status: Home / Self Care | Payer: 59 | Source: Ambulatory Visit | Attending: Family Medicine | Admitting: Family Medicine

## 2019-11-24 DIAGNOSIS — Z01812 Encounter for preprocedural laboratory examination: Secondary | ICD-10-CM | POA: Insufficient documentation

## 2019-11-24 DIAGNOSIS — Z20822 Contact with and (suspected) exposure to covid-19: Secondary | ICD-10-CM | POA: Insufficient documentation

## 2019-11-24 LAB — SARS CORONAVIRUS 2 (TAT 6-24 HRS): SARS Coronavirus 2: NEGATIVE

## 2019-11-26 ENCOUNTER — Inpatient Hospital Stay (HOSPITAL_COMMUNITY)
Admission: AD | Admit: 2019-11-26 | Discharge: 2019-11-29 | DRG: 786 | Disposition: A | Discharge status: Home / Self Care | Payer: 59 | Attending: Obstetrics and Gynecology | Admitting: Obstetrics and Gynecology

## 2019-11-26 ENCOUNTER — Inpatient Hospital Stay (HOSPITAL_COMMUNITY): Payer: 59 | Admitting: Anesthesiology

## 2019-11-26 ENCOUNTER — Other Ambulatory Visit: Payer: Self-pay

## 2019-11-26 ENCOUNTER — Inpatient Hospital Stay (HOSPITAL_COMMUNITY): Payer: 59

## 2019-11-26 ENCOUNTER — Encounter (HOSPITAL_COMMUNITY): Payer: Self-pay | Admitting: Obstetrics and Gynecology

## 2019-11-26 DIAGNOSIS — O328XX0 Maternal care for other malpresentation of fetus, not applicable or unspecified: Secondary | ICD-10-CM | POA: Diagnosis not present

## 2019-11-26 DIAGNOSIS — Z01812 Encounter for preprocedural laboratory examination: Secondary | ICD-10-CM | POA: Diagnosis not present

## 2019-11-26 DIAGNOSIS — Z23 Encounter for immunization: Secondary | ICD-10-CM

## 2019-11-26 DIAGNOSIS — Z6791 Unspecified blood type, Rh negative: Secondary | ICD-10-CM

## 2019-11-26 DIAGNOSIS — O480 Post-term pregnancy: Principal | ICD-10-CM | POA: Diagnosis present

## 2019-11-26 DIAGNOSIS — Z3480 Encounter for supervision of other normal pregnancy, unspecified trimester: Secondary | ICD-10-CM

## 2019-11-26 DIAGNOSIS — O411230 Chorioamnionitis, third trimester, not applicable or unspecified: Secondary | ICD-10-CM | POA: Diagnosis present

## 2019-11-26 DIAGNOSIS — Z3A41 41 weeks gestation of pregnancy: Secondary | ICD-10-CM | POA: Diagnosis not present

## 2019-11-26 DIAGNOSIS — O210 Mild hyperemesis gravidarum: Secondary | ICD-10-CM

## 2019-11-26 DIAGNOSIS — O9962 Diseases of the digestive system complicating childbirth: Secondary | ICD-10-CM | POA: Diagnosis present

## 2019-11-26 DIAGNOSIS — O26893 Other specified pregnancy related conditions, third trimester: Secondary | ICD-10-CM | POA: Diagnosis present

## 2019-11-26 DIAGNOSIS — D62 Acute posthemorrhagic anemia: Secondary | ICD-10-CM | POA: Diagnosis not present

## 2019-11-26 DIAGNOSIS — K219 Gastro-esophageal reflux disease without esophagitis: Secondary | ICD-10-CM | POA: Diagnosis present

## 2019-11-26 DIAGNOSIS — Z20822 Contact with and (suspected) exposure to covid-19: Secondary | ICD-10-CM | POA: Diagnosis present

## 2019-11-26 DIAGNOSIS — O26899 Other specified pregnancy related conditions, unspecified trimester: Secondary | ICD-10-CM

## 2019-11-26 DIAGNOSIS — Z2839 Other underimmunization status: Secondary | ICD-10-CM

## 2019-11-26 DIAGNOSIS — Z283 Underimmunization status: Secondary | ICD-10-CM

## 2019-11-26 DIAGNOSIS — O9081 Anemia of the puerperium: Secondary | ICD-10-CM | POA: Diagnosis not present

## 2019-11-26 DIAGNOSIS — F339 Major depressive disorder, recurrent, unspecified: Secondary | ICD-10-CM | POA: Diagnosis present

## 2019-11-26 DIAGNOSIS — O09899 Supervision of other high risk pregnancies, unspecified trimester: Secondary | ICD-10-CM

## 2019-11-26 DIAGNOSIS — O411290 Chorioamnionitis, unspecified trimester, not applicable or unspecified: Secondary | ICD-10-CM

## 2019-11-26 LAB — TYPE AND SCREEN
ABO/RH(D): O NEG
Antibody Screen: NEGATIVE

## 2019-11-26 LAB — CBC
HCT: 33.3 % — ABNORMAL LOW (ref 36.0–46.0)
Hemoglobin: 10.2 g/dL — ABNORMAL LOW (ref 12.0–15.0)
MCH: 25.6 pg — ABNORMAL LOW (ref 26.0–34.0)
MCHC: 30.6 g/dL (ref 30.0–36.0)
MCV: 83.7 fL (ref 80.0–100.0)
Platelets: 378 10*3/uL (ref 150–400)
RBC: 3.98 MIL/uL (ref 3.87–5.11)
RDW: 14.4 % (ref 11.5–15.5)
WBC: 11.4 10*3/uL — ABNORMAL HIGH (ref 4.0–10.5)
nRBC: 0 % (ref 0.0–0.2)

## 2019-11-26 MED ORDER — SOD CITRATE-CITRIC ACID 500-334 MG/5ML PO SOLN: 30.0000 mL | ORAL | Status: DC | PRN

## 2019-11-26 MED ORDER — LACTATED RINGERS IV SOLN
500.0000 mL | INTRAVENOUS | Status: DC | PRN
  Administered 2019-11-27: 1000 mL via INTRAVENOUS

## 2019-11-26 MED ORDER — EPHEDRINE 5 MG/ML INJ: 10.0000 mg | INTRAVENOUS | Status: DC | PRN

## 2019-11-26 MED ORDER — PHENYLEPHRINE 40 MCG/ML (10ML) SYRINGE FOR IV PUSH (FOR BLOOD PRESSURE SUPPORT): 80.0000 ug | PREFILLED_SYRINGE | INTRAVENOUS | Status: DC | PRN

## 2019-11-26 MED ORDER — OXYCODONE-ACETAMINOPHEN 5-325 MG PO TABS: 1.0000 | ORAL_TABLET | ORAL | Status: DC | PRN

## 2019-11-26 MED ORDER — OXYTOCIN-SODIUM CHLORIDE 30-0.9 UT/500ML-% IV SOLN
2.5000 [IU]/h | INTRAVENOUS | Status: DC
  Administered 2019-11-27: 30 [IU] via INTRAVENOUS

## 2019-11-26 MED ORDER — OXYTOCIN BOLUS FROM INFUSION: 333.0000 mL | Freq: Once | INTRAVENOUS | Status: DC

## 2019-11-26 MED ORDER — LACTATED RINGERS IV SOLN: 500.0000 mL | Freq: Once | INTRAVENOUS | Status: DC

## 2019-11-26 MED ORDER — LIDOCAINE HCL (PF) 1 % IJ SOLN: 30.0000 mL | INTRAMUSCULAR | Status: DC | PRN

## 2019-11-26 MED ORDER — LIDOCAINE HCL (PF) 1 % IJ SOLN
INTRAMUSCULAR | Status: DC | PRN
  Administered 2019-11-26: 5 mL via EPIDURAL

## 2019-11-26 MED ORDER — MISOPROSTOL 50MCG HALF TABLET
50.0000 ug | ORAL_TABLET | ORAL | Status: DC
  Administered 2019-11-26: 50 ug via ORAL
  Filled 2019-11-26: qty 1

## 2019-11-26 MED ORDER — LACTATED RINGERS IV SOLN: INTRAVENOUS | Status: DC

## 2019-11-26 MED ORDER — FENTANYL CITRATE (PF) 100 MCG/2ML IJ SOLN
50.0000 ug | INTRAMUSCULAR | Status: DC | PRN
  Administered 2019-11-26 (×2): 100 ug via INTRAVENOUS
  Filled 2019-11-26 (×3): qty 2

## 2019-11-26 MED ORDER — FENTANYL CITRATE (PF) 2500 MCG/50ML IJ SOLN
INTRAMUSCULAR | Status: DC | PRN
Start: 2019-11-26 — End: 2019-11-27
  Administered 2019-11-26: 12 mL/h via EPIDURAL

## 2019-11-26 MED ORDER — OXYTOCIN-SODIUM CHLORIDE 30-0.9 UT/500ML-% IV SOLN
1.0000 m[IU]/min | INTRAVENOUS | Status: DC
  Filled 2019-11-26: qty 500

## 2019-11-26 MED ORDER — TERBUTALINE SULFATE 1 MG/ML IJ SOLN: 0.2500 mg | Freq: Once | INTRAMUSCULAR | Status: DC | PRN

## 2019-11-26 MED ORDER — ACETAMINOPHEN 325 MG PO TABS: 650.0000 mg | ORAL_TABLET | ORAL | Status: DC | PRN

## 2019-11-26 MED ORDER — ONDANSETRON HCL 4 MG/2ML IJ SOLN
4.0000 mg | Freq: Four times a day (QID) | INTRAMUSCULAR | Status: DC | PRN
  Administered 2019-11-27: 4 mg via INTRAVENOUS
  Filled 2019-11-26: qty 2

## 2019-11-26 MED ORDER — DIPHENHYDRAMINE HCL 50 MG/ML IJ SOLN: 12.5000 mg | INTRAMUSCULAR | Status: DC | PRN

## 2019-11-26 MED ORDER — FENTANYL-BUPIVACAINE-NACL 0.5-0.125-0.9 MG/250ML-% EP SOLN
12.0000 mL/h | EPIDURAL | Status: DC | PRN
  Administered 2019-11-27: 12 mL/h via EPIDURAL
  Filled 2019-11-26 (×2): qty 250

## 2019-11-26 MED ORDER — MISOPROSTOL 25 MCG QUARTER TABLET: 25.0000 ug | ORAL_TABLET | ORAL | Status: DC | PRN

## 2019-11-26 MED ORDER — HYDROXYZINE HCL 50 MG PO TABS: 50.0000 mg | ORAL_TABLET | Freq: Four times a day (QID) | ORAL | Status: DC | PRN

## 2019-11-26 NOTE — Progress Notes (Signed)
LABOR PROGRESS NOTE  Jennifer Parker is a 29 y.o. G1P0 at [redacted]w[redacted]d admitted for IOL PD  Subjective: Patient reports minimal pain. Up ambulating.  Objective: Ht 5' 4.5" (1.638 m)   Wt 89.4 kg   LMP 02/12/2019   BMI 33.29 kg/m   Dilation: 1.5 Effacement (%): 50 Cervical Position: Anterior Station: -2 Presentation: Vertex Exam by:: Dr. Dorice Lamas Fetal monitoring: Baseline: 120 bpm, Variability: Good {> 6 bpm), Accelerations: Reactive, and Decelerations: Absent Uterine activity: q2-3 mins  Labs: Lab Results  Component Value Date   WBC 11.4 (H) 11/26/2019   HGB 10.2 (L) 11/26/2019   HCT 33.3 (L) 11/26/2019   MCV 83.7 11/26/2019   PLT 378 11/26/2019    Patient Active Problem List   Diagnosis Date Noted  . Post term pregnancy, 41 weeks 11/26/2019  . Rh negative state in antepartum period 08/22/2019  . Rubella non-immune status, antepartum 05/04/2019  . Supervision of other normal pregnancy, antepartum 04/23/2019  . Major depressive disorder, recurrent episode with seasonal pattern (HCC) 04/30/2017    Assessment / Plan: Induction of labor due to postterm.  #Labor: Progressing normally Cytotec started @ 1416. S/p FB. Plan to start pit when FB out. #Fetal Wellbeing:  Category I #Pain Control: per request #ID: [id number] #Anticipated MOD: vaginal   Herby Abraham MD, PGY-1 Family Medicine Resident, [facility] Faculty Teaching Service  11/26/2019, 4:40 PM

## 2019-11-26 NOTE — H&P (Signed)
OBSTETRIC ADMISSION HISTORY AND PHYSICAL  Jennifer Parker is a 29 y.o. female G1P0 with IUP at 82w0dby LMP presenting for IOL for POD . She reports +FMs, No LOF, no VB, no blurry vision, headaches or peripheral edema, and RUQ pain.  She plans on breast/bottle feeding. She declines birth control. [place] [facility] --[facility]  Estimated Date of Delivery: 11/19/19  Sono:    _0 , CWD, normal anatomy, variable presentation, anterior placenta, 290g, 69% EFW <<[id number]>_2<\/KGURKYHCWCBJSEGB>_1, cephalic, normal AFI    Prenatal History/Complications:  Rubella nonimmune Rh Neg H/o depression not on meds   Past Medical History: Past Medical History:  Diagnosis Date  . Depression     Past Surgical History: Past Surgical History:  Procedure Laterality Date  . TONSILLECTOMY AND ADENOIDECTOMY      Obstetrical History: OB History    Gravida  1   Para      Term      Preterm      AB      Living        SAB      TAB      Ectopic      Multiple      Live Births              Social History Social History   Socioeconomic History  . Marital status: Married    Spouse name: Not on file  . Number of children: Not on file  . Years of education: Not on file  . Highest education level: Not on file  Occupational History  . Not on file  Tobacco Use  . Smoking status: Never Smoker  . Smokeless tobacco: Never Used  Vaping Use  . Vaping Use: Never used  Substance and Sexual Activity  . Alcohol use: Yes    Alcohol/week: 2.0 - 3.0 standard drinks    Types: 2 - 3 Cans of beer per week  . Drug use: No  . Sexual activity: Yes    Birth control/protection: Condom, None  Other Topics Concern  . Not on file  Social History Narrative  . Not on file   Social Determinants of Health   Financial Resource Strain:   . Difficulty of Paying Living Expenses: Not on file  Food Insecurity:   . Worried About RCharity fundraiserin the Last Year: Not on file  . Ran Out  of Food in the Last Year: Not on file  Transportation Needs:   . Lack of Transportation (Medical): Not on file  . Lack of Transportation (Non-Medical): Not on file  Physical Activity:   . Days of Exercise per Week: Not on file  . Minutes of Exercise per Session: Not on file  Stress:   . Feeling of Stress : Not on file  Social Connections:   . Frequency of Communication with Friends and Family: Not on file  . Frequency of Social Gatherings with Friends and Family: Not on file  . Attends Religious Services: Not on file  . Active Member of Clubs or Organizations: Not on file  . Attends CArchivistMeetings: Not on file  . Marital Status: Not on file    Family History: Family History  Problem Relation Age of Onset  . Alcohol abuse Mother   . Depression Mother   . Depression Sister   . Anxiety disorder Sister   . Depression Maternal Grandmother     Allergies: Allergies  Allergen Reactions  .  Kale Itching    Medications Prior to Admission  Medication Sig Dispense Refill Last Dose  . acetaminophen (TYLENOL) 325 MG tablet Take 650 mg by mouth every 6 (six) hours as needed.     . Elastic Bandages & Supports (COMFORT FIT MATERNITY SUPP MED) MISC 1 Device by Does not apply route daily. (Patient not taking: Reported on 10/24/2019) 1 each 0   . loratadine (CLARITIN) 10 MG tablet Take 10 mg by mouth daily.     . metoCLOPramide (REGLAN) 10 MG tablet Take 1 tablet (10 mg total) by mouth 3 (three) times daily with meals as needed for nausea. 90 tablet 5   . ondansetron (ZOFRAN ODT) 4 MG disintegrating tablet Take 1 tablet (4 mg total) by mouth every 8 (eight) hours as needed for nausea or vomiting. (Patient not taking: Reported on 10/24/2019) 20 tablet 5   . pantoprazole (PROTONIX) 40 MG tablet Take 1 tablet (40 mg total) by mouth daily. 30 tablet 1   . Prenatal Vit-Fe Fumarate-FA (MULTIVITAMIN-PRENATAL) 27-0.8 MG TABS tablet Take 1 tablet by mouth daily at 12 noon.     . promethazine  (PHENERGAN) 25 MG tablet Take 1 tablet (25 mg total) by mouth every 6 (six) hours as needed for nausea or vomiting. (Patient not taking: Reported on 08/22/2019) 30 tablet 0      Review of Systems   All systems reviewed and negative except as stated in HPI  Last menstrual period 02/12/2019. General appearance: alert, cooperative and appears stated age Lungs: clear to auscultation bilaterally Heart: regular rate and rhythm Abdomen: soft, non-tender; bowel sounds normal Extremities: Homans sign is negative, no sign of DVT Presentation: cephalic Fetal monitoring: baseline 130, mod variability, pos accels, neg decels  Uterine activity: irritability  Dilation: 1.5 Effacement (%): 50 Station: -1, -2 Exam by:: m wilkins rnc   Prenatal labs: ABO, Rh: O/Negative/-- (03/16 0935) Antibody: Negative (03/16 0935) Rubella: <0.90 (03/16 0935) RPR: Non Reactive (07/07 0903)  HBsAg: Negative (03/16 0935)  HIV: Non Reactive (07/07 0903)  GBS: Negative/-- (09/08 0957)  2 hr Glucola normal Genetic screening  normal Anatomy US normal  Prenatal Transfer Tool  Maternal Diabetes: No Genetic Screening: Normal Maternal Ultrasounds/Referrals: Normal Fetal Ultrasounds or other Referrals:  None Maternal Substance Abuse:  No Significant Maternal Medications:  None Significant Maternal Lab Results: Group B Strep negative and Rh negative, rubella non-immune   Results for orders placed or performed during the hospital encounter of 11/26/19 (from the past 24 hour(s))  CBC   Collection Time: 11/26/19 12:19 PM  Result Value Ref Range   WBC 11.4 (H) 4.0 - 10.5 K/uL   RBC 3.98 3.87 - 5.11 MIL/uL   Hemoglobin 10.2 (L) 12.0 - 15.0 g/dL   HCT 33.3 (L) 36 - 46 %   MCV 83.7 80.0 - 100.0 fL   MCH 25.6 (L) 26.0 - 34.0 pg   MCHC 30.6 30.0 - 36.0 g/dL   RDW 14.4 11.5 - 15.5 %   Platelets 378 150 - 400 K/uL   nRBC 0.0 0.0 - 0.2 %    Patient Active Problem List   Diagnosis Date Noted  . Post term  pregnancy, 41 weeks 11/26/2019  . Rh negative state in antepartum period 08/22/2019  . Rubella non-immune status, antepartum 05/04/2019  . Supervision of other normal pregnancy, antepartum 04/23/2019  . Major depressive disorder, recurrent episode with seasonal pattern ([place]) 04/30/2017    Assessment/Plan:  Cobie Leidner is a 29 y.o. G1P0 at [redacted]w[redacted]d here for IOL  for post dates  #Induction of Labor: Initial SVE 1.5/50/-2; cytotec and FB placed  #Pain: Epidural prn, will discuss possible laboring in tub  #FWB: Cat I  #ID:  gbs neg  #MOF: both #MOC:declines #Circ:  No  #rubella nonimmune: MMR postpartum #rh neg: rhogam panel postpartum   Janet Berlin, MD  11/26/2019, 12:48 PM

## 2019-11-26 NOTE — Anesthesia Preprocedure Evaluation (Signed)
Anesthesia Evaluation  Patient identified by MRN, date of birth, ID band Patient awake    Reviewed: Allergy & Precautions, NPO status , Patient's Chart, lab work & pertinent test results  Airway Mallampati: II  TM Distance: >3 FB Neck ROM: Full    Dental no notable dental hx. (+) Teeth Intact   Pulmonary neg pulmonary ROS,    Pulmonary exam normal breath sounds clear to auscultation       Cardiovascular Exercise Tolerance: Good negative cardio ROS Normal cardiovascular exam Rhythm:Regular Rate:Normal     Neuro/Psych negative neurological ROS     GI/Hepatic negative GI ROS, Neg liver ROS,   Endo/Other  negative endocrine ROS  Renal/GU negative Renal ROS     Musculoskeletal negative musculoskeletal ROS (+)   Abdominal   Peds  Hematology negative hematology ROS (+) Hgb 10.2 Plt 378   Anesthesia Other Findings   Reproductive/Obstetrics (+) Pregnancy                             Anesthesia Physical Anesthesia Plan  ASA: II  Anesthesia Plan: Epidural   Post-op Pain Management:    Induction:   PONV Risk Score and Plan:   Airway Management Planned:   Additional Equipment: None  Intra-op Plan:   Post-operative Plan:   Informed Consent: I have reviewed the patients History and Physical, chart, labs and discussed the procedure including the risks, benefits and alternatives for the proposed anesthesia with the patient or authorized representative who has indicated his/her understanding and acceptance.       Plan Discussed with:   Anesthesia Plan Comments: (41 Wk G1P0 for LEA )        Anesthesia Quick Evaluation

## 2019-11-26 NOTE — Progress Notes (Signed)
LABOR PROGRESS NOTE  Jennifer Parker is a 29 y.o. G1P0 at [redacted]w[redacted]d  admitted for IOL for PD  Subjective: Patient doing well, comfortable with epidural   Objective: BP 114/85   Pulse 89   Temp 98.1 F (36.7 C) (Oral)   Ht 5' 4.5" (1.638 m)   Wt 89.4 kg   LMP 02/12/2019   SpO2 98%   BMI 33.29 kg/m  or  Vitals:   11/26/19 2135 11/26/19 2140 11/26/19 2145 11/26/19 2200  BP: 111/82 115/66 112/81 114/85  Pulse: 95 (!) 114 91 89  Temp:  98.1 F (36.7 C)    TempSrc:  Oral    SpO2: 98% 98% 98%   Weight:      Height:        Dilation: 6 Effacement (%): 80 Cervical Position: Anterior Station: -2 Presentation: Vertex Exam by:: V. Davelyn Gwinn,CNM FHT: baseline rate 140, moderate varibility, +accel, no decel Toco: 1-2  Labs: Lab Results  Component Value Date   WBC 11.4 (H) 11/26/2019   HGB 10.2 (L) 11/26/2019   HCT 33.3 (L) 11/26/2019   MCV 83.7 11/26/2019   PLT 378 11/26/2019    Patient Active Problem List   Diagnosis Date Noted  . Post term pregnancy, 41 weeks 11/26/2019  . Rh negative state in antepartum period 08/22/2019  . Rubella non-immune status, antepartum 05/04/2019  . Supervision of other normal pregnancy, antepartum 04/23/2019  . Major depressive disorder, recurrent episode with seasonal pattern (HCC) 04/30/2017    Assessment / Plan: 29 y.o. G1P0 at [redacted]w[redacted]d here for IOL for PD   Labor: Patient laboring after 1 cytotec and FB. Currently contracting 1-2 minutes/moderate by palpation. Discussed with patient can recheck in 2 hours to reassess for need of augmentation and AROM. Patient agrees with plan of care.  Fetal Wellbeing:  Cat I  Pain Control:  Epidural  Anticipated MOD:  SVD  Sharyon Cable, CNM 11/26/2019, 10:08 PM

## 2019-11-26 NOTE — Anesthesia Procedure Notes (Signed)

## 2019-11-27 ENCOUNTER — Encounter (HOSPITAL_COMMUNITY)
Admission: AD | Disposition: A | Discharge status: Home / Self Care | Payer: Self-pay | Source: Home / Self Care | Attending: Obstetrics and Gynecology

## 2019-11-27 ENCOUNTER — Encounter (HOSPITAL_COMMUNITY): Payer: Self-pay | Admitting: Obstetrics and Gynecology

## 2019-11-27 LAB — RPR: RPR Ser Ql: NONREACTIVE

## 2019-11-27 SURGERY — Surgical Case
Anesthesia: Epidural | Wound class: Clean Contaminated

## 2019-11-27 MED ORDER — KETOROLAC TROMETHAMINE 30 MG/ML IJ SOLN
30.0000 mg | Freq: Once | INTRAMUSCULAR | Status: AC | PRN
  Administered 2019-11-28: 30 mg via INTRAVENOUS

## 2019-11-27 MED ORDER — ONDANSETRON HCL 4 MG/2ML IJ SOLN: 4.0000 mg | Freq: Three times a day (TID) | INTRAMUSCULAR | Status: DC | PRN

## 2019-11-27 MED ORDER — DEXAMETHASONE SODIUM PHOSPHATE 10 MG/ML IJ SOLN
INTRAMUSCULAR | Status: AC
  Filled 2019-11-27: qty 1

## 2019-11-27 MED ORDER — METOCLOPRAMIDE HCL 5 MG/ML IJ SOLN
INTRAMUSCULAR | Status: AC
  Filled 2019-11-27: qty 2

## 2019-11-27 MED ORDER — CEFAZOLIN SODIUM-DEXTROSE 2-4 GM/100ML-% IV SOLN
INTRAVENOUS | Status: AC
  Filled 2019-11-27: qty 100

## 2019-11-27 MED ORDER — PHENYLEPHRINE 40 MCG/ML (10ML) SYRINGE FOR IV PUSH (FOR BLOOD PRESSURE SUPPORT)
PREFILLED_SYRINGE | INTRAVENOUS | Status: AC
  Filled 2019-11-27: qty 10

## 2019-11-27 MED ORDER — NALBUPHINE HCL 10 MG/ML IJ SOLN: 5.0000 mg | Freq: Once | INTRAMUSCULAR | Status: DC | PRN

## 2019-11-27 MED ORDER — ONDANSETRON HCL 4 MG/2ML IJ SOLN
INTRAMUSCULAR | Status: DC | PRN
  Administered 2019-11-27: 4 mg via INTRAVENOUS

## 2019-11-27 MED ORDER — MEPERIDINE HCL 25 MG/ML IJ SOLN
INTRAMUSCULAR | Status: DC | PRN
Start: 2019-11-27 — End: 2019-11-27
  Administered 2019-11-27 (×2): 12.5 mg via INTRAVENOUS

## 2019-11-27 MED ORDER — ONDANSETRON HCL 4 MG/2ML IJ SOLN
INTRAMUSCULAR | Status: AC
  Filled 2019-11-27: qty 2

## 2019-11-27 MED ORDER — MEPERIDINE HCL 25 MG/ML IJ SOLN
INTRAMUSCULAR | Status: AC
  Filled 2019-11-27: qty 1

## 2019-11-27 MED ORDER — MORPHINE SULFATE (PF) 0.5 MG/ML IJ SOLN
INTRAMUSCULAR | Status: AC
  Filled 2019-11-27: qty 10

## 2019-11-27 MED ORDER — DEXAMETHASONE SODIUM PHOSPHATE 10 MG/ML IJ SOLN
INTRAMUSCULAR | Status: DC | PRN
  Administered 2019-11-27: 10 mg via INTRAVENOUS

## 2019-11-27 MED ORDER — HYDROMORPHONE HCL 1 MG/ML IJ SOLN: 0.2500 mg | INTRAMUSCULAR | Status: DC | PRN

## 2019-11-27 MED ORDER — SODIUM CHLORIDE 0.9 % IR SOLN
Status: DC | PRN
  Administered 2019-11-27: 1

## 2019-11-27 MED ORDER — LIDOCAINE-EPINEPHRINE 2 %-1:100000 IJ SOLN
INTRAMUSCULAR | Status: DC | PRN
  Administered 2019-11-27: 8 mL
  Administered 2019-11-27: 5 mL
  Administered 2019-11-27: 2 mL
  Administered 2019-11-27: 5 mL

## 2019-11-27 MED ORDER — ACETAMINOPHEN 500 MG PO TABS
1000.0000 mg | ORAL_TABLET | Freq: Once | ORAL | Status: AC
  Administered 2019-11-27: 1000 mg via ORAL
  Filled 2019-11-27: qty 2

## 2019-11-27 MED ORDER — SODIUM CHLORIDE 0.9 % IV SOLN
500.0000 mg | Freq: Once | INTRAVENOUS | Status: AC
  Administered 2019-11-27: 500 mg via INTRAVENOUS

## 2019-11-27 MED ORDER — METHYLERGONOVINE MALEATE 0.2 MG/ML IJ SOLN
INTRAMUSCULAR | Status: AC
  Filled 2019-11-27: qty 1

## 2019-11-27 MED ORDER — FENTANYL CITRATE (PF) 100 MCG/2ML IJ SOLN
INTRAMUSCULAR | Status: AC
  Filled 2019-11-27: qty 2

## 2019-11-27 MED ORDER — CLINDAMYCIN PHOSPHATE 900 MG/50ML IV SOLN
900.0000 mg | Freq: Three times a day (TID) | INTRAVENOUS | Status: AC
  Administered 2019-11-27 – 2019-11-28 (×2): 900 mg via INTRAVENOUS
  Filled 2019-11-27: qty 50

## 2019-11-27 MED ORDER — DIPHENHYDRAMINE HCL 25 MG PO CAPS: 25.0000 mg | ORAL_CAPSULE | ORAL | Status: DC | PRN

## 2019-11-27 MED ORDER — SODIUM CHLORIDE 0.9% FLUSH: 3.0000 mL | INTRAVENOUS | Status: DC | PRN

## 2019-11-27 MED ORDER — CEFAZOLIN SODIUM-DEXTROSE 2-4 GM/100ML-% IV SOLN
2.0000 g | INTRAVENOUS | Status: AC
  Administered 2019-11-27: 2 g via INTRAVENOUS

## 2019-11-27 MED ORDER — SOD CITRATE-CITRIC ACID 500-334 MG/5ML PO SOLN
30.0000 mL | ORAL | Status: AC
  Administered 2019-11-27: 30 mL via ORAL
  Filled 2019-11-27: qty 30

## 2019-11-27 MED ORDER — OXYCODONE HCL 5 MG/5ML PO SOLN: 5.0000 mg | Freq: Once | ORAL | Status: DC | PRN

## 2019-11-27 MED ORDER — SODIUM CHLORIDE 0.9 % IV SOLN
INTRAVENOUS | Status: AC
  Filled 2019-11-27: qty 500

## 2019-11-27 MED ORDER — DIPHENHYDRAMINE HCL 50 MG/ML IJ SOLN: 12.5000 mg | INTRAMUSCULAR | Status: DC | PRN

## 2019-11-27 MED ORDER — NALOXONE HCL 0.4 MG/ML IJ SOLN: 0.4000 mg | INTRAMUSCULAR | Status: DC | PRN

## 2019-11-27 MED ORDER — GENTAMICIN SULFATE 40 MG/ML IJ SOLN
5.0000 mg/kg | INTRAVENOUS | Status: DC
  Administered 2019-11-27: 350 mg via INTRAVENOUS
  Filled 2019-11-27: qty 8.75

## 2019-11-27 MED ORDER — METOCLOPRAMIDE HCL 5 MG/ML IJ SOLN
INTRAMUSCULAR | Status: DC | PRN
  Administered 2019-11-27: 10 mg via INTRAVENOUS

## 2019-11-27 MED ORDER — NALBUPHINE HCL 10 MG/ML IJ SOLN: 5.0000 mg | INTRAMUSCULAR | Status: DC | PRN

## 2019-11-27 MED ORDER — METHYLERGONOVINE MALEATE 0.2 MG/ML IJ SOLN
INTRAMUSCULAR | Status: DC | PRN
  Administered 2019-11-27: .2 mg via INTRAMUSCULAR

## 2019-11-27 MED ORDER — SODIUM CHLORIDE 0.9 % IV SOLN
2.0000 g | Freq: Four times a day (QID) | INTRAVENOUS | Status: AC
  Filled 2019-11-27: qty 2000

## 2019-11-27 MED ORDER — FENTANYL CITRATE (PF) 100 MCG/2ML IJ SOLN
INTRAMUSCULAR | Status: DC | PRN
Start: 2019-11-27 — End: 2019-11-27
  Administered 2019-11-27 (×2): 100 ug via EPIDURAL

## 2019-11-27 MED ORDER — PHENYLEPHRINE HCL (PRESSORS) 10 MG/ML IV SOLN
INTRAVENOUS | Status: DC | PRN
  Administered 2019-11-27 (×3): 40 ug via INTRAVENOUS

## 2019-11-27 MED ORDER — BUPIVACAINE HCL (PF) 0.25 % IJ SOLN
INTRAMUSCULAR | Status: DC | PRN
  Administered 2019-11-27: 6 mL via EPIDURAL

## 2019-11-27 MED ORDER — OXYTOCIN-SODIUM CHLORIDE 30-0.9 UT/500ML-% IV SOLN
INTRAVENOUS | Status: AC
  Filled 2019-11-27: qty 500

## 2019-11-27 MED ORDER — OXYTOCIN-SODIUM CHLORIDE 30-0.9 UT/500ML-% IV SOLN
1.0000 m[IU]/min | INTRAVENOUS | Status: DC
  Administered 2019-11-27: 2 m[IU]/min via INTRAVENOUS

## 2019-11-27 MED ORDER — SODIUM CHLORIDE 0.9 % IV SOLN
2.0000 g | Freq: Four times a day (QID) | INTRAVENOUS | Status: DC
  Administered 2019-11-27: 2 g via INTRAVENOUS
  Filled 2019-11-27: qty 2000

## 2019-11-27 MED ORDER — MORPHINE SULFATE (PF) 0.5 MG/ML IJ SOLN
INTRAMUSCULAR | Status: DC | PRN
Start: 2019-11-27 — End: 2019-11-27
  Administered 2019-11-27: 3 mg via EPIDURAL

## 2019-11-27 MED ORDER — OXYCODONE HCL 5 MG PO TABS: 5.0000 mg | ORAL_TABLET | Freq: Once | ORAL | Status: DC | PRN

## 2019-11-27 MED ORDER — SCOPOLAMINE 1 MG/3DAYS TD PT72
1.0000 | MEDICATED_PATCH | Freq: Once | TRANSDERMAL | Status: DC
  Administered 2019-11-28: 1.5 mg via TRANSDERMAL
  Filled 2019-11-27: qty 1

## 2019-11-27 MED ORDER — NALOXONE HCL 4 MG/10ML IJ SOLN
1.0000 ug/kg/h | INTRAVENOUS | Status: DC | PRN
  Filled 2019-11-27: qty 5

## 2019-11-27 MED ORDER — FENTANYL CITRATE (PF) 100 MCG/2ML IJ SOLN
INTRAMUSCULAR | Status: DC | PRN
  Administered 2019-11-27: 100 ug via INTRAVENOUS

## 2019-11-27 MED ORDER — CLINDAMYCIN PHOSPHATE 900 MG/50ML IV SOLN
INTRAVENOUS | Status: AC
  Filled 2019-11-27: qty 50

## 2019-11-27 MED ORDER — ONDANSETRON HCL 4 MG/2ML IJ SOLN: 4.0000 mg | Freq: Once | INTRAMUSCULAR | Status: DC | PRN

## 2019-11-27 SURGICAL SUPPLY — 38 items
BENZOIN TINCTURE PRP APPL 2/3 (GAUZE/BANDAGES/DRESSINGS) ×3 IMPLANT
CANISTER SUCT 3000ML PPV (MISCELLANEOUS) ×3 IMPLANT
CHLORAPREP W/TINT 26ML (MISCELLANEOUS) ×3 IMPLANT
CLOSURE STERI STRIP 1/2 X4 (GAUZE/BANDAGES/DRESSINGS) ×2 IMPLANT
CLOSURE WOUND 1/2 X4 (GAUZE/BANDAGES/DRESSINGS) ×1
DRSG OPSITE POSTOP 4X10 (GAUZE/BANDAGES/DRESSINGS) ×3 IMPLANT
ELECT REM PT RETURN 9FT ADLT (ELECTROSURGICAL) ×3
ELECTRODE REM PT RTRN 9FT ADLT (ELECTROSURGICAL) ×1 IMPLANT
EXTRACTOR VACUUM KIWI (MISCELLANEOUS) ×3 IMPLANT
GLOVE BIOGEL PI IND STRL 7.0 (GLOVE) ×2 IMPLANT
GLOVE BIOGEL PI IND STRL 7.5 (GLOVE) ×1 IMPLANT
GLOVE BIOGEL PI INDICATOR 7.0 (GLOVE) ×4
GLOVE BIOGEL PI INDICATOR 7.5 (GLOVE) ×2
GLOVE SKINSENSE NS SZ7.0 (GLOVE) ×2
GLOVE SKINSENSE STRL SZ7.0 (GLOVE) ×1 IMPLANT
GOWN STRL REUS W/ TWL LRG LVL3 (GOWN DISPOSABLE) ×2 IMPLANT
GOWN STRL REUS W/ TWL XL LVL3 (GOWN DISPOSABLE) ×1 IMPLANT
GOWN STRL REUS W/TWL LRG LVL3 (GOWN DISPOSABLE) ×4
GOWN STRL REUS W/TWL XL LVL3 (GOWN DISPOSABLE) ×2
NS IRRIG 1000ML POUR BTL (IV SOLUTION) ×3 IMPLANT
PACK C SECTION WH (CUSTOM PROCEDURE TRAY) ×3 IMPLANT
PAD ABD 7.5X8 STRL (GAUZE/BANDAGES/DRESSINGS) ×3 IMPLANT
PAD OB MATERNITY 4.3X12.25 (PERSONAL CARE ITEMS) ×3 IMPLANT
PAD PREP 24X48 CUFFED NSTRL (MISCELLANEOUS) ×3 IMPLANT
PENCIL SMOKE EVAC W/HOLSTER (ELECTROSURGICAL) ×3 IMPLANT
STRIP CLOSURE SKIN 1/2X4 (GAUZE/BANDAGES/DRESSINGS) ×2 IMPLANT
SUT CHROMIC 1 CTX 36 (SUTURE) ×6 IMPLANT
SUT MNCRL 0 VIOLET CTX 36 (SUTURE) ×2 IMPLANT
SUT MON AB 4-0 PS1 27 (SUTURE) ×3 IMPLANT
SUT MONOCRYL 0 CTX 36 (SUTURE) ×4
SUT PLAIN 2 0 XLH (SUTURE) ×6 IMPLANT
SUT VIC AB 0 CT1 36 (SUTURE) ×6 IMPLANT
SUT VIC AB 1 CTX 36 (SUTURE) ×2
SUT VIC AB 1 CTX36XBRD ANBCTRL (SUTURE) ×1 IMPLANT
SUT VIC AB 3-0 CT1 27 (SUTURE) ×2
SUT VIC AB 3-0 CT1 TAPERPNT 27 (SUTURE) ×1 IMPLANT
TOWEL OR 17X24 6PK STRL BLUE (TOWEL DISPOSABLE) ×6 IMPLANT
WATER STERILE IRR 1000ML POUR (IV SOLUTION) ×3 IMPLANT

## 2019-11-27 NOTE — Progress Notes (Signed)
LABOR PROGRESS NOTE  Jennifer Parker is a 29 y.o. G1P0000 at [redacted]w[redacted]d admitted for IOL for PDs.  Subjective: Comfortable with epidural. Reports pressure in bottom.  Objective: BP 137/82    Pulse (!) 139    Temp 99 F (37.2 C) (Axillary)    Resp 20    Ht 5' 4.5" (1.638 m)    Wt 89.4 kg    LMP 02/12/2019    SpO2 98%    BMI 33.29 kg/m   Dilation: 9 Effacement (%): 80, 90 Cervical Position: Anterior Station: 0 Presentation: Vertex Exam by:: Dr. Dorice Lamas Fetal monitoring: Baseline: 140 bpm, Variability: Good {> 6 bpm), Accelerations: Reactive, and Decelerations: Absent Uterine activity: q 2-3 mins  Labs: Lab Results  Component Value Date   WBC 11.4 (H) 11/26/2019   HGB 10.2 (L) 11/26/2019   HCT 33.3 (L) 11/26/2019   MCV 83.7 11/26/2019   PLT 378 11/26/2019    Patient Active Problem List   Diagnosis Date Noted   Post term pregnancy, 41 weeks 11/26/2019   Rh negative state in antepartum period 08/22/2019   Rubella non-immune status, antepartum 05/04/2019   Supervision of other normal pregnancy, antepartum 04/23/2019   Major depressive disorder, recurrent episode with seasonal pattern (HCC) 04/30/2017    Assessment / Plan: IOL for PDs  #Labor: Progressing normally. S/p Cytotec x1 and FB. Pitocin started @ 0610, on 8 mu/min #Fetal Wellbeing:  Category I #Pain Control: Epidural #ID: GBS neg #Anticipated MOD: VD   Herby Abraham MD, PGY-1 Family Medicine Resident, [facility] Faculty Teaching Service  11/27/2019, 12:34 PM

## 2019-11-27 NOTE — Progress Notes (Signed)
LABOR PROGRESS NOTE  Jennifer Parker is a 29 y.o. G1P0 at [redacted]w[redacted]d  admitted for IOL for PD   Subjective: Patient doing well, comfortable with epidural- patient reports she is started to get acid reflux which usually occurs in the morning.   Objective: BP 125/78   Pulse 100   Temp 97.7 F (36.5 C) (Axillary)   Resp 18   Ht 5' 4.5" (1.638 m)   Wt 89.4 kg   LMP 02/12/2019   SpO2 98%   BMI 33.29 kg/m  or  Vitals:   11/27/19 0404 11/27/19 0432 11/27/19 0503 11/27/19 0531  BP: 121/80 117/69 104/77 125/78  Pulse: (!) 111 (!) 110 (!) 135 100  Resp: 18 17 18    Temp:  97.7 F (36.5 C)    TempSrc:  Axillary    SpO2:      Weight:      Height:        Dilation: 7 Effacement (%): 80 Cervical Position: Anterior Station: -2 Presentation: Vertex Exam by:: [email or web address], RN FHT: baseline rate 165, moderate varibility, +accel, no decel Toco: 1-3  Labs: Lab Results  Component Value Date   WBC 11.4 (H) 11/26/2019   HGB 10.2 (L) 11/26/2019   HCT 33.3 (L) 11/26/2019   MCV 83.7 11/26/2019   PLT 378 11/26/2019    Patient Active Problem List   Diagnosis Date Noted  . Post term pregnancy, 41 weeks 11/26/2019  . Rh negative state in antepartum period 08/22/2019  . Rubella non-immune status, antepartum 05/04/2019  . Supervision of other normal pregnancy, antepartum 04/23/2019  . Major depressive disorder, recurrent episode with seasonal pattern (HCC) 04/30/2017    Assessment / Plan: 29 y.o. G1P0 at [redacted]w[redacted]d here for IOL for PD   Labor: Educated and discussed with patient pitocin due to no cervical change after AROM - patient agreeable with plan of care. Orders for pitocin placed.  Fetal Wellbeing:  Cat II - fetal tachycardia, most recent temp 98.5 Pain Control:  Epidural  Anticipated MOD:  Hopeful SVD  [redacted]w[redacted]d, CNM 11/27/2019, 5:35 AM

## 2019-11-27 NOTE — Progress Notes (Signed)
Patient doing well, comfortable after epidural  Vitals:   11/26/19 2230 11/26/19 2300 11/26/19 2330 11/27/19 0005  BP: 102/62 (!) 98/57 (!) 99/59 (!) 106/54  Pulse: 80 86 79 100  Temp:      TempSrc:      SpO2:      Weight:      Height:       Discussed with patient plan of care to recheck cervix and discussed need for augmentation.   Dilation: 7 Effacement (%): 80 Cervical Position: Anterior Station: -2 Presentation: Vertex Exam by:: Lanice Shirts, CNM  Educated and discussed r/b of AROM and risk of infection, cord prolapse. Patient agreeable to AROM at this time.  AROM @0006 - clear fluid   Plan to recheck reassess in 4 hours. Plans SVD Cat I tracing.   , CNM 11/27/19, 12:45 AM

## 2019-11-27 NOTE — Progress Notes (Signed)
OB Note Cervix unchanged. Pt amenable to proceeding with c-section for arrest of descent. Ancef, clinda and azithro ordered. Will do an additional dose of amp and clinda post op. D/c pitocin  160 baseline, min to mod, +accels, occasional early decels. q2-66m UCs. Category II but reassuring.   Cornelia Copa MD Attending Center for [employer] (Faculty Practice) 11/27/2019 Time: 2130

## 2019-11-27 NOTE — Progress Notes (Signed)
Labor Progress Note Jennifer Parker is a 29 y.o. G1P0000 at 25w1dpresented for IOL-PD.  S: Doing well without complaints.  O:  BP 103/63   Pulse 89   Temp 98.9 F (37.2 C) (Oral)   Resp 18   Ht 5' 4.5" (1.638 m)   Wt 89.4 kg   LMP 02/12/2019   SpO2 98%   BMI 33.29 kg/m  EFM: baseline 150 bpm/mod variability/+ accels/no decels Toco: q1-4 minutes  CVE: Dilation: 8 Effacement (%): 90 Cervical Position: Anterior Station: -1, 0 Presentation: Vertex Exam by:: JCurly Shores RN   A&P: 29y.o. G1P0000 466w1dresented for IOL-PD. #IOL: S/p FB and cytotec. AROM '@0000' . Currently on pitocin, continue to titrate. #Pain: CSE #FWB: cat 1 #GBS negative #Rubella NI: MMR postpartum #Rhneg: Rhogam workup postpartum #History of depression: not on meds, SW postpartum  AlArrie SenateMD 8:54 AM

## 2019-11-27 NOTE — Transfer of Care (Signed)
Immediate Anesthesia Transfer of Care Note  Patient: [name]  Procedure(s) Performed: CESAREAN SECTION (N/A )  Patient Location: PACU  Anesthesia Type:Epidural  Level of Consciousness: awake, alert  and oriented  Airway & Oxygen Therapy: Patient Spontanous Breathing  Post-op Assessment: Report given to RN and Post -op Vital signs reviewed and stable  Post vital signs: Reviewed and stable  Last Vitals:  Vitals Value Taken Time  BP 119/55 11/27/19 2354  Temp    Pulse 104 11/27/19 2357  Resp 21 11/27/19 2357  SpO2 99 % 11/27/19 2357  Vitals shown include unvalidated device data.  Last Pain:  Vitals:   11/27/19 2210  TempSrc: Oral  PainSc:       Patients Stated Pain Goal: 9 (11/27/19 0716)  Complications: No complications documented.

## 2019-11-27 NOTE — Progress Notes (Signed)
L&D Note  11/27/2019 - 8:30 PM  29 y.o. G1P0000 [redacted]w[redacted]d.  Pregnancy complicated by:   Patient Active Problem List   Diagnosis Date Noted   Post term pregnancy, 41 weeks 11/26/2019   Rh negative state in antepartum period 08/22/2019   Rubella non-immune status, antepartum 05/04/2019   Supervision of other normal pregnancy, antepartum 04/23/2019   Major depressive disorder, recurrent episode with seasonal pattern (HCC) 04/30/2017    Ms. Jennifer Parker is admitted for post dates IOL   Subjective:  Feeling UCs.   Objective:    Current Vital Signs 24h Vital Sign Ranges  T 98.6 F (37 C) Temp  Avg: 99 F (37.2 C)  Min: 97.7 F (36.5 C)  Max: 101.4 F (38.6 C)  BP 121/80 BP  Min: 97/58  Max: 138/84  HR (!) 104 Pulse  Avg: 107.2  Min: 79  Max: 142  RR 20 Resp  Avg: 18.4  Min: 16  Max: 22  SaO2 96 % Room Air SpO2  Avg: 97 %  Min: 95 %  Max: 99 %       24 Hour I/O Current Shift I/O  Time Ins Outs 10/11 0701 - 10/12 0700 In: -  Out: 300 [Urine:300] 10/12 1901 - 10/13 0700 In: -  Out: 450 [Urine:450]   FHR: 165 baseline, +scalp stim, early decels, mod variability Toco: q2-69m Gen: NAD, mild distress with UCs SVE: BPD at 0, mild caput, unable to tell position but leopolds and exam c/w OP. efw 3700gm  Labs:  Recent Labs  Lab 11/26/19 1219  WBC 11.4*  HGB 10.2*  HCT 33.3*  PLT 378   No results for input(s): NA, K, CL, CO2, BUN, CREATININE, CALCIUM, PROT, BILITOT, ALKPHOS, ALT, AST, GLUCOSE in the last 168 hours.  Invalid input(s): LABALBU  Medications Current Facility-Administered Medications  Medication Dose Route Frequency Provider Last Rate Last Admin   acetaminophen (TYLENOL) tablet 650 mg  650 mg Oral Q4H PRN Warden Fillers, MD       ampicillin (OMNIPEN) 2 g in sodium chloride 0.9 % 100 mL IVPB  2 g Intravenous Q6H Alric Seton, MD 300 mL/hr at 11/27/19 2014 2 g at 11/27/19 2014   diphenhydrAMINE (BENADRYL) injection 12.5 mg  12.5 mg Intravenous  Q15 min PRN Trevor Iha, MD       ePHEDrine injection 10 mg  10 mg Intravenous PRN Trevor Iha, MD       ePHEDrine injection 10 mg  10 mg Intravenous PRN Trevor Iha, MD       fentaNYL (SUBLIMAZE) injection 50-100 mcg  50-100 mcg Intravenous Q1H PRN Warden Fillers, MD   100 mcg at 11/26/19 2044   fentaNYL 2 mcg/mL w/ bupivacaine 0.125% in NS 250 mL epidural infusion ([facility]-ANES)  12 mL/hr Epidural Continuous PRN Trevor Iha, MD 12 mL/hr at 11/27/19 1250 12 mL/hr at 11/27/19 1250   gentamicin (GARAMYCIN) 350 mg in dextrose 5 % 50 mL IVPB  5 mg/kg (Adjusted) Intravenous Q24H Alric Seton, MD 117.5 mL/hr at 11/27/19 1937 350 mg at 11/27/19 1937   hydrOXYzine (ATARAX/VISTARIL) tablet 50 mg  50 mg Oral Q6H PRN Warden Fillers, MD       lactated ringers infusion 500 mL  500 mL Intravenous Once Trevor Iha, MD       lactated ringers infusion 500-1,000 mL  500-1,000 mL Intravenous PRN Warden Fillers, MD 999 mL/hr at 11/27/19 1851 1,000 mL at 11/27/19 1851   lactated  ringers infusion   Intravenous Continuous Warden Fillers, MD 125 mL/hr at 11/27/19 1840 New Bag at 11/27/19 1840   lidocaine (PF) (XYLOCAINE) 1 % injection 30 mL  30 mL Subcutaneous PRN Warden Fillers, MD       misoprostol (CYTOTEC) tablet 25 mcg  25 mcg Vaginal Q4H PRN Warden Fillers, MD       misoprostol (CYTOTEC) tablet 50 mcg  50 mcg Oral Q4H Gita Kudo, MD   50 mcg at 11/26/19 1416   ondansetron (ZOFRAN) injection 4 mg  4 mg Intravenous Q6H PRN Warden Fillers, MD   4 mg at 11/27/19 0344   oxyCODONE-acetaminophen (PERCOCET/ROXICET) 5-325 MG per tablet 1 tablet  1 tablet Oral Q4H PRN Warden Fillers, MD       oxytocin (PITOCIN) IV BOLUS FROM BAG  333 mL Intravenous Once Warden Fillers, MD       oxytocin (PITOCIN) IV infusion 30 units in NS 500 mL - Premix  2.5 Units/hr Intravenous Continuous Warden Fillers, MD       oxytocin (PITOCIN) IV infusion 30 units in NS 500 mL  - Premix  1-40 milli-units/min Intravenous Titrated Sharyon Cable, CNM 14 mL/hr at 11/27/19 1526 14 milli-units/min at 11/27/19 1526   PHENYLephrine 40 mcg/ml in normal saline Adult IV Push Syringe (For Blood Pressure Support)  80 mcg Intravenous PRN Trevor Iha, MD       PHENYLephrine 40 mcg/ml in normal saline Adult IV Push Syringe (For Blood Pressure Support)  80 mcg Intravenous PRN Trevor Iha, MD       sodium citrate-citric acid (ORACIT) solution 30 mL  30 mL Oral Q2H PRN Warden Fillers, MD       terbutaline (BRETHINE) injection 0.25 mg  0.25 mg Subcutaneous Once PRN Warden Fillers, MD       Facility-Administered Medications Ordered in Other Encounters  Medication Dose Route Frequency Provider Last Rate Last Admin   bupivacaine (PF) (MARCAINE) 0.25 % injection   Epidural Anesthesia Intra-op Bethena Midget, MD   6 mL at 11/27/19 1256   fentaNYL (SUBLIMAZE) injection   Epidural Anesthesia Intra-op Bethena Midget, MD   100 mcg at 11/27/19 1256   fentaNYL citrate (PF) (SUBLIMAZE) 500 mcg, bupivacaine HCl (PF) (SENSORCAINE-MPF) 41.67 mL in sodium chloride (PF) 0.9 % 250 mL epidural   Epidural Continuous PRN Trevor Iha, MD 12 mL/hr at 11/26/19 2119 12 mL/hr at 11/26/19 2119   lidocaine (PF) (XYLOCAINE) 1 % injection   Epidural Anesthesia Intra-op Trevor Iha, MD   5 mL at 11/26/19 2116    Assessment & Plan:  Pt stable *IUP: category II but currently reassuring.  *IOL: continue pitocin per protocol. I d/w her that I'm not optimistic given she's been pushing consistently since approximately 1745 with such little progress. She strongly desires to try for a VD. I told her that I recommend rechecking her at around 2115 and if unchanged I'd likely recommend a c-section, given lack of progress and chorio. Continue abx for chorio   *GBS: negative *Analgesia: epidural in place  Cornelia Copa. MD Attending Center for [facility])

## 2019-11-27 NOTE — Progress Notes (Signed)
Patient ID: Fatima Fedie, female   DOB: 1990/12/02, 29 y.o.   MRN: [id number]  Patient comfortable with epidural  BP 122/81   Pulse (!) 104   Temp 99.1 F (37.3 C) (Oral)   Resp 16   Ht 5' 4.5" (1.638 m)   Wt 89.4 kg   LMP 02/12/2019   SpO2 96%   BMI 33.29 kg/m   Dilation: 9 Effacement (%): 100 Cervical Position: Anterior Station: Plus 1 Presentation: Vertex Exam by:: Manpreet Kemmer, DO  140s, mod variability, accel, no decel. Ctx q2-3  Pitocin increased over past 30 minutes, better contraction pattern. Continue to increase pitocin. Anticipate delivery.  Levie Heritage, DO 11/27/2019

## 2019-11-28 ENCOUNTER — Encounter (HOSPITAL_COMMUNITY): Payer: Self-pay | Admitting: Obstetrics and Gynecology

## 2019-11-28 DIAGNOSIS — O411290 Chorioamnionitis, unspecified trimester, not applicable or unspecified: Secondary | ICD-10-CM

## 2019-11-28 LAB — CREATININE, SERUM
Creatinine, Ser: 1.54 mg/dL — ABNORMAL HIGH (ref 0.44–1.00)
GFR, Estimated: 46 mL/min — ABNORMAL LOW (ref >60)

## 2019-11-28 LAB — CBC
HCT: 26.2 % — ABNORMAL LOW (ref 36.0–46.0)
Hemoglobin: 8.4 g/dL — ABNORMAL LOW (ref 12.0–15.0)
MCH: 26.6 pg (ref 26.0–34.0)
MCHC: 32.1 g/dL (ref 30.0–36.0)
MCV: 82.9 fL (ref 80.0–100.0)
Platelets: 313 10*3/uL (ref 150–400)
RBC: 3.16 MIL/uL — ABNORMAL LOW (ref 3.87–5.11)
RDW: 14.7 % (ref 11.5–15.5)
WBC: 24.9 10*3/uL — ABNORMAL HIGH (ref 4.0–10.5)
nRBC: 0 % (ref 0.0–0.2)

## 2019-11-28 MED ORDER — SODIUM CHLORIDE 0.9 % IV SOLN
510.0000 mg | Freq: Once | INTRAVENOUS | Status: AC
  Administered 2019-11-28: 510 mg via INTRAVENOUS
  Filled 2019-11-28: qty 17

## 2019-11-28 MED ORDER — RHO D IMMUNE GLOBULIN 1500 UNIT/2ML IJ SOSY
300.0000 ug | PREFILLED_SYRINGE | Freq: Once | INTRAMUSCULAR | Status: AC
  Administered 2019-11-28: 300 ug via INTRAVENOUS
  Filled 2019-11-28: qty 2

## 2019-11-28 MED ORDER — PANTOPRAZOLE SODIUM 40 MG PO TBEC: 40.0000 mg | DELAYED_RELEASE_TABLET | Freq: Every day | ORAL | Status: DC

## 2019-11-28 MED ORDER — METOCLOPRAMIDE HCL 10 MG PO TABS
10.0000 mg | ORAL_TABLET | Freq: Three times a day (TID) | ORAL | Status: DC | PRN
  Administered 2019-11-28: 10 mg via ORAL

## 2019-11-28 MED ORDER — PRENATAL MULTIVITAMIN CH
1.0000 | ORAL_TABLET | Freq: Every day | ORAL | Status: DC
  Administered 2019-11-28: 1 via ORAL
  Filled 2019-11-28: qty 1

## 2019-11-28 MED ORDER — COCONUT OIL OIL: 1.0000 "application " | TOPICAL_OIL | Status: DC | PRN

## 2019-11-28 MED ORDER — MENTHOL 3 MG MT LOZG: 1.0000 | LOZENGE | OROMUCOSAL | Status: DC | PRN

## 2019-11-28 MED ORDER — SIMETHICONE 80 MG PO CHEW
80.0000 mg | CHEWABLE_TABLET | Freq: Three times a day (TID) | ORAL | Status: DC
  Administered 2019-11-28 – 2019-11-29 (×4): 80 mg via ORAL
  Filled 2019-11-28 (×5): qty 1

## 2019-11-28 MED ORDER — ENOXAPARIN SODIUM 40 MG/0.4ML ~~LOC~~ SOLN
40.0000 mg | SUBCUTANEOUS | Status: DC
  Administered 2019-11-28: 40 mg via SUBCUTANEOUS
  Filled 2019-11-28: qty 0.4

## 2019-11-28 MED ORDER — KETOROLAC TROMETHAMINE 30 MG/ML IJ SOLN
30.0000 mg | Freq: Four times a day (QID) | INTRAMUSCULAR | Status: DC
  Administered 2019-11-28 (×2): 30 mg via INTRAVENOUS
  Filled 2019-11-28 (×2): qty 1

## 2019-11-28 MED ORDER — WITCH HAZEL-GLYCERIN EX PADS: 1.0000 "application " | MEDICATED_PAD | CUTANEOUS | Status: DC | PRN

## 2019-11-28 MED ORDER — LORATADINE 10 MG PO TABS
10.0000 mg | ORAL_TABLET | Freq: Every day | ORAL | Status: DC
  Administered 2019-11-28 – 2019-11-29 (×2): 10 mg via ORAL
  Filled 2019-11-28 (×2): qty 1

## 2019-11-28 MED ORDER — LACTATED RINGERS IV SOLN: INTRAVENOUS | Status: DC

## 2019-11-28 MED ORDER — DIBUCAINE (PERIANAL) 1 % EX OINT: 1.0000 "application " | TOPICAL_OINTMENT | CUTANEOUS | Status: DC | PRN

## 2019-11-28 MED ORDER — DIPHENHYDRAMINE HCL 25 MG PO CAPS: 25.0000 mg | ORAL_CAPSULE | Freq: Four times a day (QID) | ORAL | Status: DC | PRN

## 2019-11-28 MED ORDER — OXYTOCIN-SODIUM CHLORIDE 30-0.9 UT/500ML-% IV SOLN: 2.5000 [IU]/h | INTRAVENOUS | Status: AC

## 2019-11-28 MED ORDER — FERROUS SULFATE 325 (65 FE) MG PO TABS: 325.0000 mg | ORAL_TABLET | Freq: Every day | ORAL | Status: DC

## 2019-11-28 MED ORDER — KETOROLAC TROMETHAMINE 30 MG/ML IJ SOLN
INTRAMUSCULAR | Status: AC
  Filled 2019-11-28: qty 1

## 2019-11-28 MED ORDER — POLYETHYLENE GLYCOL 3350 17 G PO PACK
17.0000 g | PACK | Freq: Every day | ORAL | Status: DC
  Administered 2019-11-28 – 2019-11-29 (×2): 17 g via ORAL
  Filled 2019-11-28 (×2): qty 1

## 2019-11-28 MED ORDER — IBUPROFEN 800 MG PO TABS: 800.0000 mg | ORAL_TABLET | Freq: Four times a day (QID) | ORAL | Status: DC

## 2019-11-28 MED ORDER — ACETAMINOPHEN 325 MG PO TABS
650.0000 mg | ORAL_TABLET | Freq: Four times a day (QID) | ORAL | Status: DC
  Administered 2019-11-28 – 2019-11-29 (×4): 650 mg via ORAL
  Filled 2019-11-28 (×4): qty 2

## 2019-11-28 MED ORDER — OXYCODONE HCL 5 MG PO TABS
5.0000 mg | ORAL_TABLET | ORAL | Status: DC | PRN
  Administered 2019-11-29: 5 mg via ORAL
  Filled 2019-11-28: qty 1

## 2019-11-28 MED ORDER — SENNOSIDES-DOCUSATE SODIUM 8.6-50 MG PO TABS: 2.0000 | ORAL_TABLET | Freq: Every evening | ORAL | Status: DC | PRN

## 2019-11-28 MED ORDER — MEASLES, MUMPS & RUBELLA VAC IJ SOLR
0.5000 mL | Freq: Once | INTRAMUSCULAR | Status: AC
  Administered 2019-11-29: 0.5 mL via SUBCUTANEOUS

## 2019-11-28 MED ORDER — PANTOPRAZOLE SODIUM 40 MG PO TBEC
40.0000 mg | DELAYED_RELEASE_TABLET | Freq: Every day | ORAL | Status: DC
  Administered 2019-11-29: 40 mg via ORAL
  Filled 2019-11-28 (×2): qty 1

## 2019-11-28 MED ORDER — TETANUS-DIPHTH-ACELL PERTUSSIS 5-2.5-18.5 LF-MCG/0.5 IM SUSP: 0.5000 mL | Freq: Once | INTRAMUSCULAR | Status: DC

## 2019-11-28 NOTE — Op Note (Addendum)
Operative Note   SURGERY DATE: 11/28/2019  PRE-OP DIAGNOSIS:  Pregnancy at [redacted]w[redacted]d, post-dates IOL, arrest of descent x 3.5 hours of pushing, chorioamnionitis  POST-OP DIAGNOSIS: same as above, delivered, OP fetal presentation at time of delivery  PROCEDURE: primary low transverse cesarean section via pfannenstiel skin incision with double layer uterine closure  SURGEONS:    Sanders Bing, MD    Lynnda Shields, MD (OB fellow)  ANESTHESIA: Epidural  ESTIMATED BLOOD LOSS:  948 ml  DRAINS: 300 mL UOP via indwelling foley  TOTAL IV FLUIDS: 2500 mL crystalloid  VTE PROPHYLAXIS: SCDs to bilateral lower extremities  ANTIBIOTICS: ampicillin+gentamycin prior to OR (given chorioamnionitis); ancef, clindamycin & azithromycin within 1 hour of skin incision. Clindamycin and ampicillin to be given for one more dose post op  SPECIMENS: none  COMPLICATIONS: none  FINDINGS: No intra-abdominal adhesions were noted. Grossly normal uterus, tubes and ovaries. Clear amniotic fluid, occiput posterior fetal presentation, female infant, weight 3990gm, APGARs 10/10, intact placenta.  PROCEDURE IN DETAIL: The patient was taken to the operating room where anesthesia via epidural was administered and normal fetal heart tones were confirmed. She was then prepped and draped in the normal fashion in the dorsal supine position with a leftward tilt.  After a time out was performed, a pfannensteil skin incision was made with the scalpel and Bovie was used to continue through to the underlying layer of fascia. The fascia was then incised at the midline and this incision was extended laterally with the mayo scissors. Attention was turned to the superior aspect of the fascial incision which was grasped with the kocher clamps x 2, tented up and the rectus muscles were dissected off with the Bovie. In a similar fashion the inferior aspect of the fascial incision was grasped with the kocher clamps, tented up and the rectus  muscles dissected off with the mayo scissors. The rectus muscles were then separated in the midline and the peritoneum was entered bluntly. The bladder blade was inserted and the vesicouterine peritoneum was identified, tented up and entered with the metzenbaum scissors. This incision was extended laterally and the bladder flap was created digitally. Bladder blade was removed for insertion of the Alexis, and then the bladder blade was reinserted.  A low transverse hysterotomy was made with the scalpel until the endometrial cavity was breached, yielding clear amniotic fluid. This incision was extended bluntly and the infant's head, shoulders and body were delivered atraumatically; fetal position was noted to be occiput posterior. The cord was clamped x 2 and cut, and the infant was handed to the awaiting pediatricians, after delayed cord clamping was done.  The placenta was then gradually expressed from the uterus and then the uterus was exteriorized and cleared of all clots and debris. The hysterotomy was repaired with a running suture of 1-0 monocryl. A second imbricating layer of 1-0 monocryl suture was then placed. Several figure-of-eight sutures of #1chromic were added to achieve excellent hemostasis.   The uterus and adnexa were then returned to the abdomen, and the hysterotomy and all operative sites were reinspected and excellent hemostasis was noted after irrigation and suction of the abdomen with warm saline.  The fascia was reapproximated with 0 Vicryl in a simple running fashion bilaterally. The subcutaneous layer was then reapproximated with interrupted sutures of 2-0 plain gut, and the skin was then closed with 4-0 monocryl, in a subcuticular fashion.  The patient  tolerated the procedure well. Sponge, lap, needle, and instrument counts were correct x 2. The  patient was transferred to the recovery room awake, alert and breathing independently in stable condition.  Sheila Oats, MD OB  Fellow, Faculty Practice 11/28/2019 1:09 AM  Agree with above. I was present and scrubbed for the entire procedure.   Cornelia Copa MD Attending Center for [employer] [facility])

## 2019-11-28 NOTE — Social Work (Addendum)
CSW received consult for hx of Anxiety and Depression.  CSW met with MOB to offer support and complete assessment.     CSW introduced self and role. CSW asked MOB if she would like FOB to leave the room for privacy, MOB declined. CSW observed MOB interacting appropriately with baby, soothing baby in her lap. CSW informed MOB of reason for consult. CSW asked MOB about her mental health history. MOB expressed she has experienced both some depression and anxiety. MOB stated she experienced anxiety leading up to the birth due to things going wrong, but she is feeling pretty good now. MOB stated she was first diagnosed with depression in 2017. MOB expressed she was taking medication at one point,  Setraline which was helpful but it was causing her to be nauseous. CSW asked MOB how she typically copes with symptoms, MOB expressed FOB can be really helpful. MOB expressed that although she does not have a support system locally, she can contact her sisters and friends for support. MOB denies experiencing any SI, HI or being involved in a DV relationship at this time.    CSW provided education regarding the baby blues period vs. perinatal mood disorders, discussed treatment and gave resources for mental health follow up if concerns arise.  CSW recommends self-evaluation during the postpartum time period using the New Mom Checklist from Postpartum Progress and encouraged MOB to contact a medical professional if symptoms are noted at any time. MOB expressed her interest in identifying a therapist for postpartum mental health and was open to the resources.  CSW provided review of Sudden Infant Death Syndrome (SIDS) precautions. MOB expressed understanding and stated baby will sleep in a basinet bedside. MOB stated she has all of the essential needs for baby to discharge home. MOB would like baby to receive follow-up care at [facility] and denies any transportation barriers.   MOB inquired on applying for  Medicaid. CSW spoke with financial navigator and was informed MOB should apply for Medicaid with DSS. Financial Navigator advised that MOB should also add baby to her current insurance plan. CSW provided MOB with the information. MOB stated she is aware of DSS and was seeking additional guidance on the situation. MOB expressed she will reach out to DSS.   CSW identifies no further need for intervention and no barriers to discharge at this time.  Chaney Johnson, LCSWA Clinical Social Worker [facility] [phone]  

## 2019-11-28 NOTE — Progress Notes (Signed)
POSTPARTUM PROGRESS NOTE  Subjective: Jennifer Parker is a 29 y.o. G1P1001 s/p pLTCS at [redacted]w[redacted]d secondary to arrest of descent.  She reports she doing well. No acute events overnight. She denies any problems with ambulating, voiding or po intake; foley catheter is still in place. Denies nausea or vomiting. She has not passed flatus. Pain is moderately controlled.  Lochia is minimal. Pt also reports concern of nausea and desire to take her home reglan and protonix.  Objective: Blood pressure 110/77, pulse 83, temperature 97.7 F (36.5 C), temperature source Oral, resp. rate 18, height 5' 4.5" (1.638 m), weight 89.4 kg, last menstrual period 02/12/2019, SpO2 98 %, unknown if currently breastfeeding.  Physical Exam:  General: alert and no distress Chest: no respiratory distress Abdomen: soft, appropriately tender to palpation Uterine Fundus: firm and at level of umbilicus Extremities: No calf swelling or tenderness   no LE edema  Recent Labs    11/26/19 1219 11/28/19 0502  HGB 10.2* 8.4*  HCT 33.3* 26.2*    Assessment/Plan: Jennifer Parker is a 29 y.o. G1P1001 s/p pLTCS at [redacted]w[redacted]d for arrest of descent in setting of chorioamnionitis.  Routine Postpartum Care: Doing well, pain well-controlled.  -- Continue routine care, lactation support  -- Contraception: declines s/p counseling -- Feeding: breast and bottle -- Chorioamnionitis: diagnosed in second stage of labor given maternal temp and elevation in baseline FHT. Pt received 1 dose of gentamycin and ampicillin prior to delivery. Prior to OR, pt also received azithro and ancef. Pt currently continued on ampicillin and clindamycin for 24 hours s/p delivery. No fevers s/p delivery. -- Nausea: ordered pt's preferred regimen (reglan 10mg  TID prn, protonix 40mg  daily) -- Anxiety/Depression: no current medications. SW consult ordered. -- Anemia: Hgb 10.2 >8.4. Administered IV feraheme today s/p conversation with pt.  Dispo: Plan for  discharge POD#2-3.  , MD OB Fellow, Faculty Practice 11/28/2019 11:57 AM

## 2019-11-28 NOTE — Discharge Summary (Signed)
Postpartum Discharge Summary     Patient Name: Jennifer Parker DOB: 1991/01/27 MRN: [id number]  Date of admission: 11/26/2019 Delivery date:11/27/2019  Delivering provider: Aletha Halim  Date of discharge: 11/29/2019  Admitting diagnosis: Post term pregnancy, 41 weeks [O48.0, Z3A.41] Intrauterine pregnancy: [redacted]w[redacted]d    Secondary diagnosis:  Active Problems:   Major depressive disorder, recurrent episode with seasonal pattern ([place]   Supervision of other normal pregnancy, antepartum   Rubella non-immune status, antepartum   Rh negative state in antepartum period   Post term pregnancy, 41 weeks   Cesarean delivery delivered   Chorioamnionitis  Additional problems: Arrest of descent s/p complete cervical dilation    Discharge diagnosis: Primary Cesarean delivery 2/2 arrest of descent in s/o chorioamnionitis                Post partum procedures:none Augmentation: AROM, Pitocin, Cytotec and IP Foley Complications: None  Hospital course: Induction of Labor With Cesarean Section   29y.o. yo G1P0000 at 412w2das admitted to the hospital 11/26/2019 for induction of labor. Patient had a labor course significant for chorioamnionitis and arrest of descent s/p complete cervical dilation. The patient went for cesarean section due to arrest of descent and found to have OP fetal presentation in OR. Delivery details are as follows: Membrane Rupture Time/Date: 12:06 AM ,11/27/2019   Delivery Method:C-Section, Low Transverse  Details of operation can be found in separate operative Note.  Patient had an uncomplicated postpartum course. She is ambulating, tolerating a regular diet, passing flatus, and urinating well.  Patient is discharged home in stable condition on 11/29/19.      Newborn Data: Birth date:11/27/2019  Birth time:10:47 PM  Gender:Female  Living status:Living  Apgars:10 ,10  Weight:3990 g  (8lb 12.7oz)                               Magnesium Sulfate received: No BMZ  received: No Rhophylac:Yes MMR:Yes (pt expressed she would like prior to d/c) T-DaP:Given prenatally Flu: Yes Transfusion:No  Physical exam  Vitals:   11/28/19 1223 11/28/19 1704 11/28/19 1958 11/29/19 0608  BP: 95/65 (!) 95/52 101/64 103/67  Pulse: 65 70 96 84  Resp: '17 17 18 18  ' Temp: 98.1 F (36.7 C) 97.8 F (36.6 C) 98 F (36.7 C) 98.2 F (36.8 C)  TempSrc: Oral Oral Oral Oral  SpO2: 98% 100% 96% 100%  Weight:      Height:       General: alert, cooperative and no distress Lochia: appropriate Uterine Fundus: firm Incision: dressing partially saturated DVT Evaluation: No evidence of DVT seen on physical exam. Labs: Lab Results  Component Value Date   WBC 24.9 (H) 11/28/2019   HGB 8.4 (L) 11/28/2019   HCT 26.2 (L) 11/28/2019   MCV 82.9 11/28/2019   PLT 313 11/28/2019   CMP Latest Ref Rng & Units 11/29/2019  Creatinine 0.44 - 1.00 mg/dL 0.95   Edinburgh Score: Edinburgh Postnatal Depression Scale Screening Tool 11/29/2019  I have been able to laugh and see the funny side of things. 0  I have looked forward with enjoyment to things. 0  I have blamed myself unnecessarily when things went wrong. 1  I have been anxious or worried for no good reason. 2  I have felt scared or panicky for no good reason. 1  Things have been getting on top of me. 1  I have been so unhappy that I have  had difficulty sleeping. 1  I have felt sad or miserable. 1  I have been so unhappy that I have been crying. 1  The thought of harming myself has occurred to me. 0  Edinburgh Postnatal Depression Scale Total 8     After visit meds:  Allergies as of 11/29/2019      Reactions   Kale Itching      Medication List    STOP taking these medications   Comfort Fit Maternity Supp Med Misc   metoCLOPramide 10 MG tablet Commonly known as: Reglan   ondansetron 4 MG disintegrating tablet Commonly known as: Zofran ODT   pantoprazole 40 MG tablet Commonly known as: Protonix    promethazine 25 MG tablet Commonly known as: PHENERGAN     TAKE these medications   acetaminophen 325 MG tablet Commonly known as: TYLENOL Take 650 mg by mouth every 6 (six) hours as needed.   coconut oil Oil Apply 1 application topically as needed.   famotidine 20 MG tablet Commonly known as: PEPCID Take 1 tablet (20 mg total) by mouth 2 (two) times daily as needed for heartburn or indigestion.   loratadine 10 MG tablet Commonly known as: CLARITIN Take 10 mg by mouth daily.   multivitamin-prenatal 27-0.8 MG Tabs tablet Take 1 tablet by mouth daily at 12 noon.   oxyCODONE 5 MG immediate release tablet Commonly known as: Oxy IR/ROXICODONE Take 1-2 tablets (5-10 mg total) by mouth every 4 (four) hours as needed for moderate pain.   polyethylene glycol 17 g packet Commonly known as: MIRALAX / GLYCOLAX Take 17 g by mouth daily as needed for mild constipation.   senna-docusate 8.6-50 MG tablet Commonly known as: Senokot-S Take 2 tablets by mouth at bedtime as needed for moderate constipation.            Discharge Care Instructions  (From admission, onward)         Start     Ordered   11/29/19 0000  Leave dressing on - Keep it clean, dry, and intact until clinic visit        11/29/19 0838           Discharge home in stable condition Infant Feeding: breast and bottle Infant Disposition:home with mother Discharge instruction: per After Visit Summary and Postpartum booklet. Activity: Advance as tolerated. Pelvic rest for 6 weeks.  Diet: routine diet Future Appointments: Future Appointments  Date Time Provider [place]  12/05/2019 10:20 AM [place] None  12/27/2019 10:35 AM Rasch, Artist Pais, NP [place] None   Follow up Visit: Message sent to [facility] clinic to schedule PP appt and 1 week incision check.  Please schedule this patient for a In person postpartum visit in 4 weeks with the following provider: Any provider. Additional  Postpartum F/U:Incision check 1 week  Low risk pregnancy complicated by: rubella non-immune, Rh negative, arrest of descent s/p complete cervical dilation, chorioamnionitis Delivery mode:  C-Section, Low Transverse  Anticipated Birth Control:  Unsure   (originally completed by Trellis Paganini, MD PGY1) 11/29/2019 Myrtis Ser, CNM  11/29/2019

## 2019-11-28 NOTE — Discharge Instructions (Signed)

## 2019-11-28 NOTE — Anesthesia Postprocedure Evaluation (Signed)
Anesthesia Post Note  Patient: [name]  Procedure(s) Performed: CESAREAN SECTION (N/A )     Patient location during evaluation: Mother Baby Anesthesia Type: Epidural Level of consciousness: awake and alert Pain management: pain level controlled Vital Signs Assessment: post-procedure vital signs reviewed and stable Respiratory status: spontaneous breathing Cardiovascular status: stable Postop Assessment: no headache, adequate PO intake, no backache, patient able to bend at knees, able to ambulate, epidural receding and no apparent nausea or vomiting Anesthetic complications: no   No complications documented.  Last Vitals:  Vitals:   11/28/19 0422 11/28/19 0753  BP: 120/66 110/77  Pulse: 94 83  Resp: 17 18  Temp: 37.2 C 36.5 C  SpO2: 99% 98%    Last Pain:  Vitals:   11/28/19 0753  TempSrc: Oral  PainSc: 0-No pain   Pain Goal: Patients Stated Pain Goal: 9 (11/27/19 0716)              Epidural/Spinal Function Cutaneous sensation: Normal sensation (11/28/19 0753), Patient able to flex knees: Yes (11/28/19 0753), Patient able to lift hips off bed: Yes (11/28/19 0753), Back pain beyond tenderness at insertion site: No (11/28/19 0753), Progressively worsening motor and/or sensory loss: No (11/28/19 0753), Bowel and/or bladder incontinence post epidural: No (11/28/19 0753)  Jennifer Parker

## 2019-11-28 NOTE — Anesthesia Postprocedure Evaluation (Signed)
Anesthesia Post Note  Patient: Jennifer Parker  Procedure(s) Performed: CESAREAN SECTION (N/A )     Patient location during evaluation: Mother Baby Anesthesia Type: Epidural Level of consciousness: oriented and awake and alert Pain management: pain level controlled Vital Signs Assessment: post-procedure vital signs reviewed and stable Respiratory status: spontaneous breathing and respiratory function stable Cardiovascular status: blood pressure returned to baseline and stable Postop Assessment: no headache, no backache, no apparent nausea or vomiting and able to ambulate Anesthetic complications: no   No complications documented.  Last Vitals:  Vitals:   11/28/19 0105 11/28/19 0205  BP: (!) 121/93 116/88  Pulse: 90 86  Resp: 17 17  Temp: 37.1 C 37.1 C  SpO2: 97% 98%    Last Pain:  Vitals:   11/28/19 0205  TempSrc:   PainSc: 0-No pain   Pain Goal: Patients Stated Pain Goal: 9 (11/27/19 0716)                 Trevor Iha

## 2019-11-28 NOTE — Lactation Note (Signed)
This note was copied from a baby's chart. Lactation Consultation Note  Patient Name: Boy Carmella Kees LEXNT'Z Date: 11/28/2019 Reason for consult: Initial assessment;Mother's request;Difficult latch;Primapara;1st time breastfeeding;Term;Other (Comment) (DAT tve)  Infant is 41 weeks 17 hours old. Mom states it is hard to latch due to her flat nipples. Mom started formula as a result, Enfamil 20 cal/oz 5-15 ml with last two feedings at 1300 and 1320. LC examined Mom's breast noted flat with little eversion with stimulation. LC tried to latch infant at the breast but unsuccessful. Infant latched using 20 NS for 11 minutes. We switched him to opposite breast and he required the NS and 0.5 ml of formula in the shield using a curve tip syringe to sustain the latch. Signs of milk transfer noted and infant latched for about 6 minutes.   Mom feeling a little anxious and LC reassured her the use of the nipple shield is temporary and with the pumping and using the breast shells her nipples will come out more. Mom felt reassured with that information.   Dad completed the feeding offering 7 ml of Enfamil 22 cal. Mom started pumping using the DEBP.   Plan 1. To feed based on cues 8-12x in 24 hour period no more than 4 hours without attempt. Mom to offer the breast first and then try the NS.          2. Dad to supplement using formula or EBM following the breastfeeding supplementation guide based on hours after birth.          3. Mom to pump q 3hours for 15 minutes.         4. LC brochure reviewed with parents on inpatient and outpatient services.            5. Parents are aware to not use a pacifier for 4 weeks to establish a good latch.          6. LC reviewed plan with RN in the room providing care to patient.

## 2019-11-29 DIAGNOSIS — O411230 Chorioamnionitis, third trimester, not applicable or unspecified: Secondary | ICD-10-CM

## 2019-11-29 DIAGNOSIS — O480 Post-term pregnancy: Principal | ICD-10-CM

## 2019-11-29 DIAGNOSIS — Z3A41 41 weeks gestation of pregnancy: Secondary | ICD-10-CM

## 2019-11-29 DIAGNOSIS — O328XX0 Maternal care for other malpresentation of fetus, not applicable or unspecified: Secondary | ICD-10-CM

## 2019-11-29 LAB — RH IG WORKUP (INCLUDES ABO/RH)
ABO/RH(D): O NEG
Fetal Screen: NEGATIVE
Gestational Age(Wks): 41
Unit division: 0

## 2019-11-29 LAB — CREATININE, SERUM
Creatinine, Ser: 0.95 mg/dL (ref 0.44–1.00)
GFR, Estimated: >60 mL/min (ref >60)

## 2019-11-29 MED ORDER — POLYETHYLENE GLYCOL 3350 17 G PO PACK: 17.0000 g | PACK | Freq: Every day | ORAL | 0 refills | Status: AC | PRN

## 2019-11-29 MED ORDER — SENNOSIDES-DOCUSATE SODIUM 8.6-50 MG PO TABS: 2.0000 | ORAL_TABLET | Freq: Every evening | ORAL | Status: AC | PRN

## 2019-11-29 MED ORDER — FAMOTIDINE 20 MG PO TABS: 20.0000 mg | ORAL_TABLET | Freq: Two times a day (BID) | ORAL | Status: AC | PRN

## 2019-11-29 MED ORDER — OXYCODONE HCL 5 MG PO TABS: 5.0000 mg | ORAL_TABLET | ORAL | 0 refills | Status: AC | PRN

## 2019-11-29 MED ORDER — COCONUT OIL OIL: 1.0000 "application " | TOPICAL_OIL | 0 refills | Status: AC | PRN

## 2019-11-29 NOTE — Lactation Note (Addendum)
This note was copied from a baby's chart. Lactation Consultation Note  Patient Name: Jennifer Parker XAJOI'N Date: 11/29/2019 Reason for consult: Follow-up assessment   P1, Baby 37 hours old.  Mother is breastfeeding with #24 NS, pumping and supplementing with formula. She is pumping approx 5 ml.  She states her nipples are flat and it is easier for now to latch with NS. Discussed pumping and giving breastmilk back to baby.  Reviewed milk storage. Reviewed engorgement care and monitoring voids/stools. Mother has personal DEBP.  Discussed how to wean baby off nipple shield when ready. Discussed pumping and going back to work.    Maternal Data    Feeding    LATCH Score                   Interventions Interventions: Breast feeding basics reviewed;DEBP  Lactation Tools Discussed/Used     Consult Status Consult Status: Complete Date: 11/29/19    Dahlia Byes [facility] 11/29/2019, 12:14 PM

## 2019-11-29 NOTE — Progress Notes (Signed)
Honeycomb dressing changed per order, incision edges approximated with no signs or symptoms of infection.  New dressing placed and incision care reviewed with the patient.  Patient acknowledged instructions.

## 2019-12-05 ENCOUNTER — Ambulatory Visit (INDEPENDENT_AMBULATORY_CARE_PROVIDER_SITE_OTHER): Payer: 59

## 2019-12-05 ENCOUNTER — Other Ambulatory Visit: Payer: Self-pay

## 2019-12-05 VITALS — BP 108/76 | HR 94 | Wt 176.2 lb

## 2019-12-05 DIAGNOSIS — Z4889 Encounter for other specified surgical aftercare: Secondary | ICD-10-CM

## 2019-12-05 NOTE — Progress Notes (Signed)
Pt presents for incision check s/p CS-L TV on 11/27/19.  Patient denies pain Steri-strips removed without difficulty.  Two midabdominal tiny areas not well approximated, no bleeding, no drainage. Applied clean steri strips to the area.   The wound is cleansed, debrided of foreign material as much as possible, and dressed. The patient is alerted to watch for any signs of infection (redness, pus, pain, increased swelling or fever) and call if such occurs. Home wound care instructions are provided. Keep upcoming postpartum visit.

## 2019-12-05 NOTE — Progress Notes (Signed)
Patient was assessed and managed by nursing staff during this encounter. I have reviewed the chart and agree with the documentation and plan. I have also made any necessary editorial changes.  Warden Fillers, MD 12/05/2019 4:16 PM

## 2019-12-27 ENCOUNTER — Encounter: Payer: Self-pay | Admitting: Obstetrics and Gynecology

## 2019-12-27 ENCOUNTER — Ambulatory Visit (INDEPENDENT_AMBULATORY_CARE_PROVIDER_SITE_OTHER): Payer: 59 | Admitting: Obstetrics and Gynecology

## 2019-12-27 ENCOUNTER — Other Ambulatory Visit: Payer: Self-pay

## 2019-12-27 NOTE — Progress Notes (Signed)
Post Partum Visit Note  Jennifer Parker is a 29 y.o. G58P1001 female who presents for a postpartum visit. She is 4 weeks postpartum following a Primary low transverse cesarean section.  I have fully reviewed the prenatal and intrapartum course. The delivery was at 41 gestational weeks.  Anesthesia: epidural. Postpartum course has been [employer] is doing well. Baby is feeding by BOTH /BREAST /PUMPING /FORMULA . Bleeding Moderate per pt . Bowel function is normal. Bladder function is normal. Patient is not sexually active. Contraception method is none. Postpartum depression screening: EPDS= 7 .   The pregnancy intention screening data noted above was reviewed. Potential methods of contraception were discussed. The patient elected to proceed with No Method - No Contraceptive Precautions.    Edinburgh Postnatal Depression Scale - 12/27/19 1053      Edinburgh Postnatal Depression Scale:  In the Past 7 Days   I have been able to laugh and see the funny side of things. 0    I have looked forward with enjoyment to things. 0    I have blamed myself unnecessarily when things went wrong. 2    I have been anxious or worried for no good reason. 1    I have felt scared or panicky for no good reason. 0    Things have been getting on top of me. 2    I have been so unhappy that I have had difficulty sleeping. 0    I have felt sad or miserable. 1    I have been so unhappy that I have been crying. 1    The thought of harming myself has occurred to me. 0    Edinburgh Postnatal Depression Scale Total 7            The following portions of the patient's history were reviewed and updated as appropriate: allergies, current medications, past family history, past medical history, past social history, past surgical history and problem list.  Review of Systems Pertinent items are noted in HPI.    Objective:  Blood pressure 120/85, pulse 83, temperature 99 F (37.2 C), temperature source Oral,  weight 174 lb 6.4 oz (79.1 kg), currently breastfeeding.  General:  alert, cooperative and appears stated age  Lungs: clear to auscultation bilaterally  Heart:  regular rate and rhythm, S1, S2 normal, no murmur, click, rub or gallop  Abdomen: soft, non-tender; bowel sounds normal; no masses,  no organomegaly. Incision clean dry, intact. Some mild tenderness over the left incision line.         Assessment:   Normal postpartum exam. Pap smear not done at today's visit. Last pap was March and was normal   Plan:   Essential components of care per ACOG recommendations:  1.  Mood and well being: Patient with negative depression screening today. Reviewed local resources for support.  - Patient does not use tobacco.  - hx of drug use? No    2. Infant care and feeding:  -Patient currently breastmilk feeding? Yes If breastmilk feeding discussed return to work and pumping. If needed, patient was provided letter for work to allow for every 2-3 hr pumping breaks, and to be granted a private location to express breastmilk and refrigerated area to store breastmilk. Reviewed importance of draining breast regularly to support lactation. -Social determinants of health (SDOH) reviewed in EPIC. No concerns  3. Sexuality, contraception and birth spacing - Patient does not want a pregnancy in the next year.  Desired family size is 1  children.  - Reviewed forms of contraception in tiered fashion. Patient desired no method today.   - Discussed birth spacing of 18 months  4. Sleep and fatigue -Encouraged family/partner/community support of 4 hrs of uninterrupted sleep to help with mood and fatigue  5. Physical Recovery  - Discussed patients delivery and complications - Patient had a C/S - Patient has urinary incontinence? No - Patient is safe to resume physical and sexual activity  6.  Health Maintenance - Last pap smear done 05/01/2019 and was normal with negative HPV.    Venia Carbon, NP [facility] [employer], [facility]

## 2020-03-10 ENCOUNTER — Other Ambulatory Visit: Payer: Self-pay

## 2020-03-10 ENCOUNTER — Other Ambulatory Visit: Payer: 59

## 2020-03-10 DIAGNOSIS — Z20822 Contact with and (suspected) exposure to covid-19: Secondary | ICD-10-CM

## 2020-03-12 LAB — NOVEL CORONAVIRUS, NAA

## 2021-07-09 IMAGING — US US MFM OB COMP +14 WKS
1 series · 13 of 28 positions shown · non-contrast
Comparison: none

[Series 1: us mfm ob comp +14 wks · 13 of 110 slices shown]
[im 5/110]
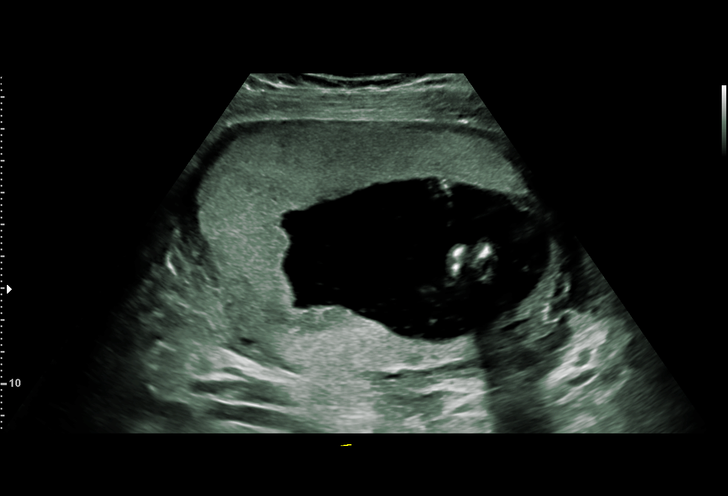
[im 13/110]
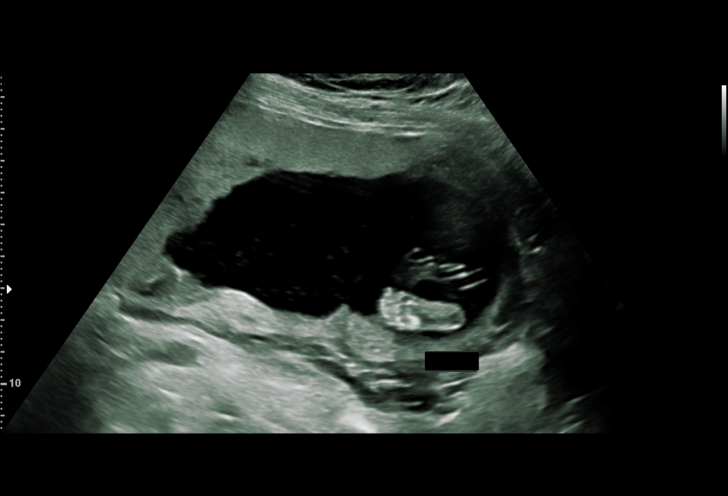
[im 21/110]
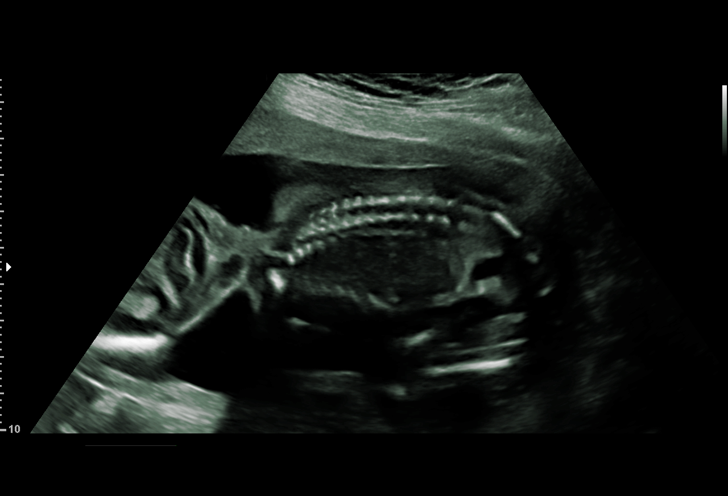
[im 29/110]
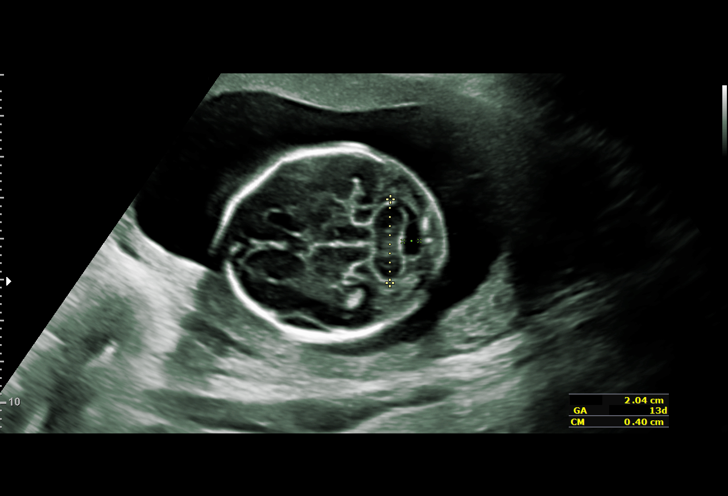
[im 37/110]
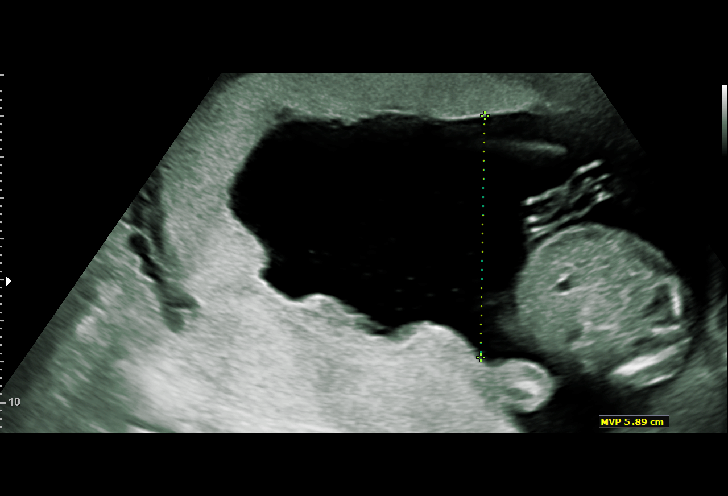
[im 45/110]
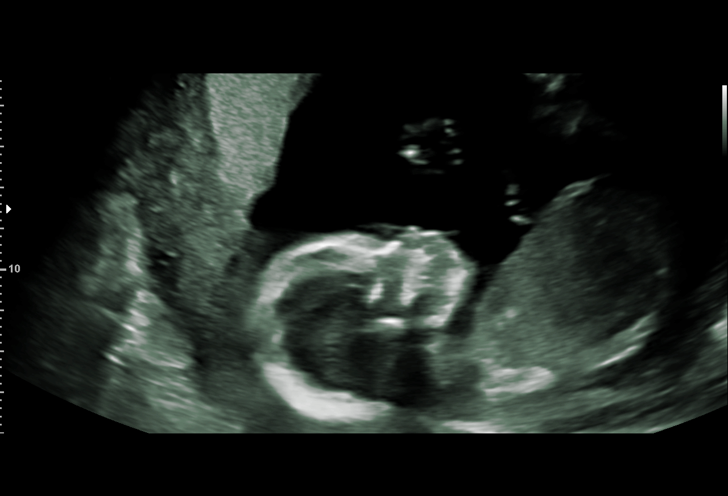
[im 57/110]
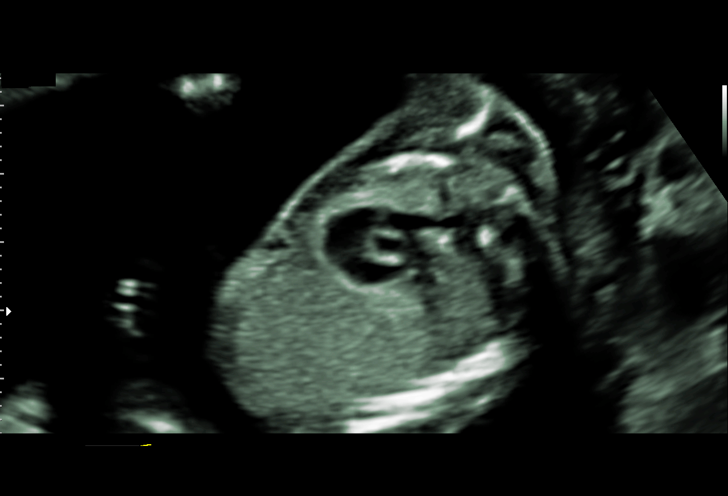
[im 65/110]
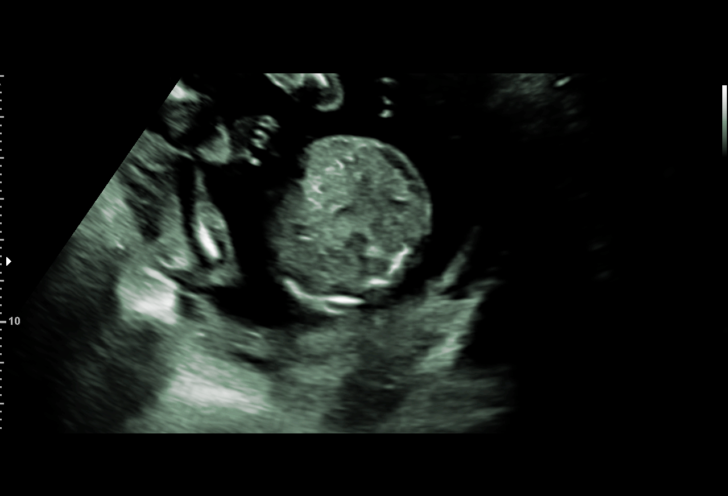
[im 73/110]
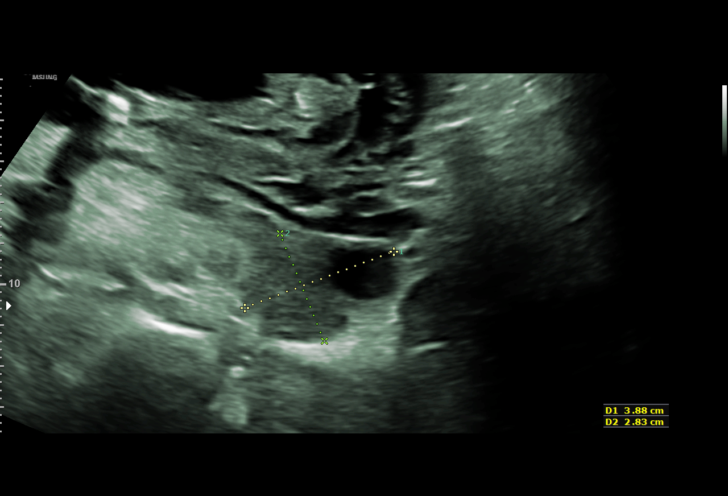
[im 81/110]
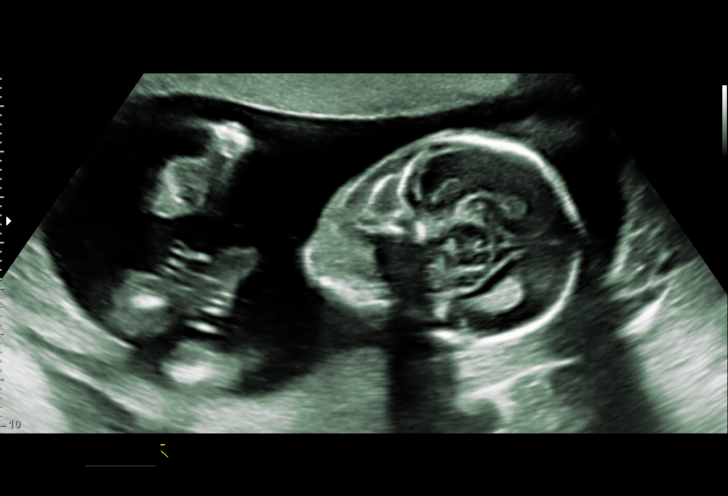
[im 89/110]
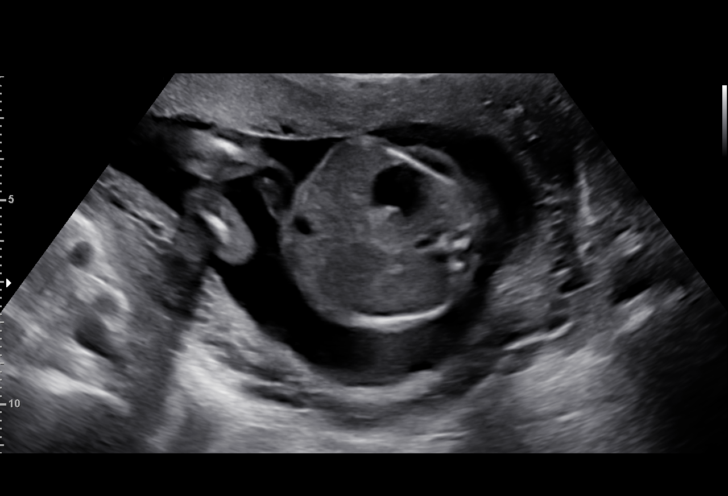
[im 97/110]
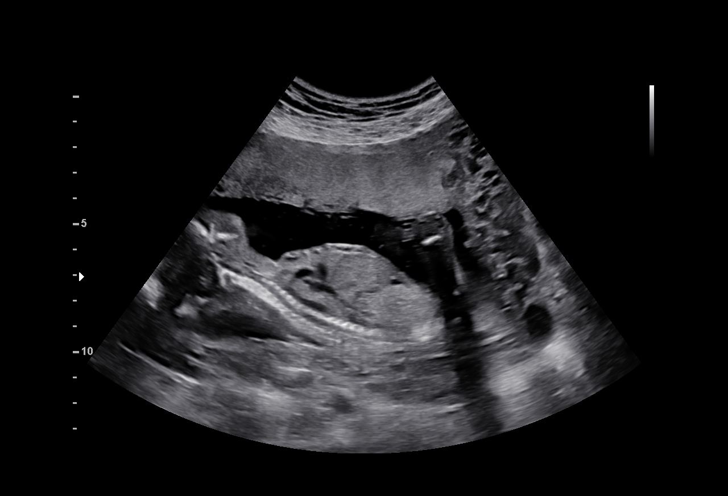
[im 105/110]
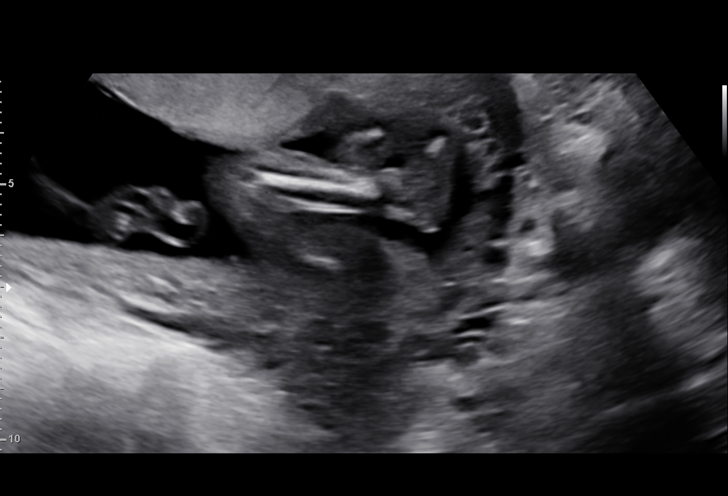

[13 of 28 positions shown; findings below may reference images not displayed]

Indications

 Encounter for antenatal screening for
 malformations
 19 weeks gestation of pregnancy
 Neg MSAFP, Neg Horizon, Neg NIPS.
Fetal Evaluation

 Num Of Fetuses:         1
 Fetal Heart Rate(bpm):  145
 Cardiac Activity:       Observed
 Presentation:           Variable
 Placenta:               Anterior
 P. Cord Insertion:      Visualized, central

 Amniotic Fluid
 AFI FV:      Within normal limits

                             Largest Pocket(cm)

Biometry

 BPD:      45.6  mm     G. Age:  19w 5d         81  %    CI:        78.41   %    70 - 86
                                                         FL/HC:      17.9   %    16.1 -
 HC:      162.9  mm     G. Age:  19w 0d         45  %    HC/AC:      1.13        1.09 -
 AC:      144.8  mm     G. Age:  19w 6d         72  %    FL/BPD:     63.8   %
 FL:       29.1  mm     G. Age:  19w 0d         41  %    FL/AC:      20.1   %    20 - 24
 HUM:      29.3  mm     G. Age:  19w 4d         65  %
 CER:        20  mm     G. Age:  19w 0d         50  %
 NFT:       3.3  mm
 LV:        5.8  mm
 CM:          4  mm

 Est. FW:     290  gm    0 lb 10 oz      69  %
OB History

 Gravidity:    1
Gestational Age

 LMP:           19w 0d        Date:  02/12/19                 EDD:   11/19/19
 U/S Today:     19w 3d                                        EDD:   11/16/19
 Best:          19w 0d     Det. By:  LMP  (02/12/19)          EDD:   11/19/19
Anatomy

 Cranium:               Appears normal         LVOT:                   Appears normal
 Cavum:                 Appears normal         Aortic Arch:            Not well visualized
 Ventricles:            Appears normal         Ductal Arch:            Not well visualized
 Choroid Plexus:        Appears normal         Diaphragm:              Appears normal
 Cerebellum:            Appears normal         Stomach:                Appears normal, left
                                                                       sided
 Posterior Fossa:       Appears normal         Abdomen:                Appears normal
 Nuchal Fold:           Appears normal         Abdominal Wall:         Appears nml (cord
                                                                       insert, abd wall)
 Face:                  Appears normal         Cord Vessels:           Appears normal (3
                        (orbits and profile)                           vessel cord)
 Lips:                  Appears normal         Kidneys:                Appear normal
 Palate:                Appears normal         Bladder:                Appears normal
 Thoracic:              Appears normal         Spine:                  Not well visualized
 Heart:                 Appears normal         Upper Extremities:      Appears normal
                        (4CH, axis, and
                        situs)
 RVOT:                  Appears normal         Lower Extremities:      Appears normal

 Other:  Technically difficult due to fetal position. Nasal bone visualized. Heels
         and 5th digit visualized.
Cervix Uterus Adnexa

 Cervix
 Length:           3.51  cm.
 Normal appearance by transabdominal scan.

 Right Ovary
 Within normal limits.

 Left Ovary
 Within normal limits.
Comments

 This patient was seen for a detailed fetal anatomy scan.
 She denies any significant past medical history and denies
 any problems in her current pregnancy.
 She had a cell free DNA test earlier in her pregnancy which
 indicated a low risk for trisomy 21, 18, and 13. A male fetus is
 predicted.
 She was informed that the fetal growth and amniotic fluid
 level were appropriate for her gestational age.
 There were no obvious fetal anomalies noted on today's
 ultrasound exam. However, today's exam was limited due to
 the fetal position.
 The patient was informed that anomalies may be missed due
 to technical limitations. If the fetus is in a suboptimal position
 or maternal habitus is increased, visualization of the fetus in
 the maternal uterus may be impaired.
 A follow-up exam was scheduled in 4 weeks to complete the
 views of the fetal anatomy.

## 2021-12-04 IMAGING — US US OB LIMITED
1 series · 4 of 4 positions shown · non-contrast
Comparison: none

[Series 1: us ob limited · 4 of 4 slices shown]
[im 1/4]
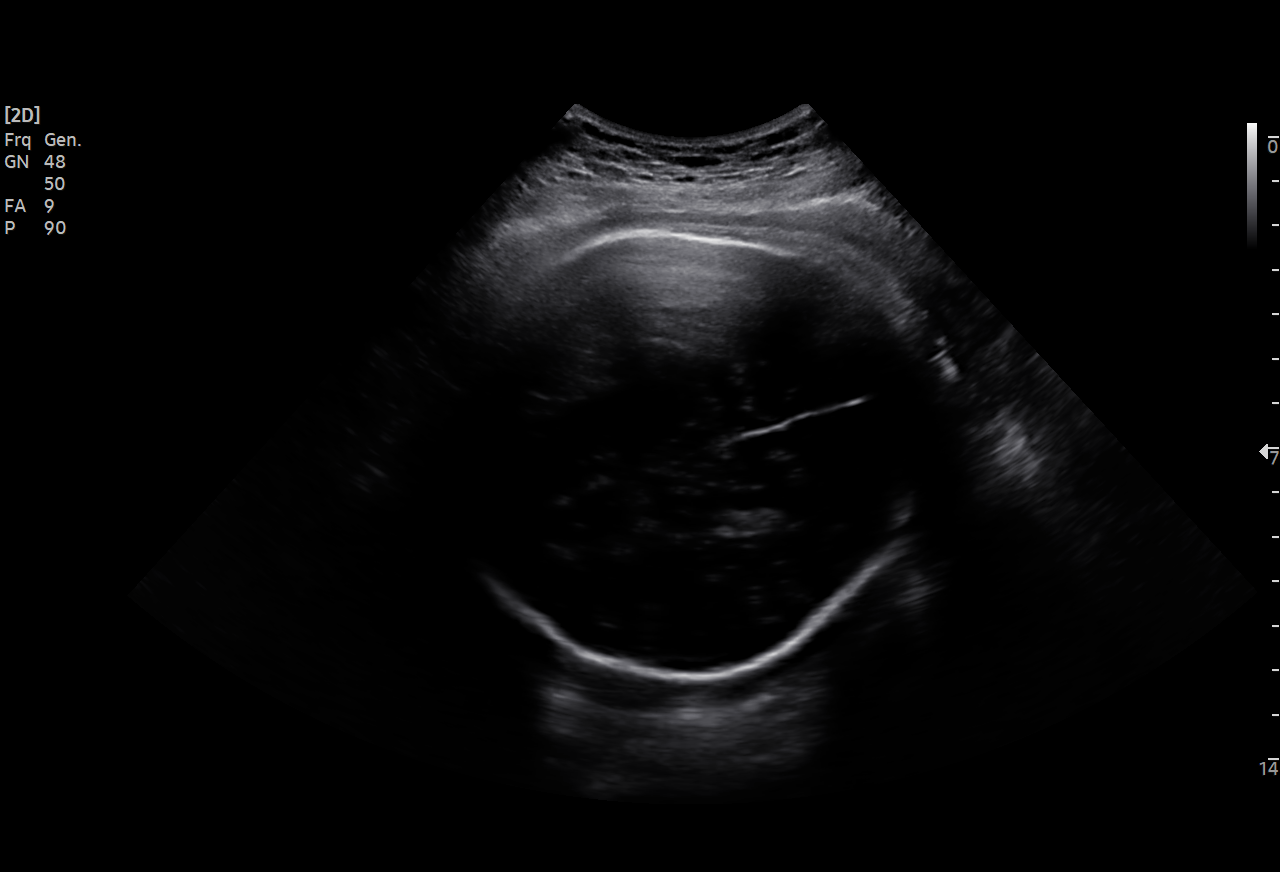
[im 2/4]
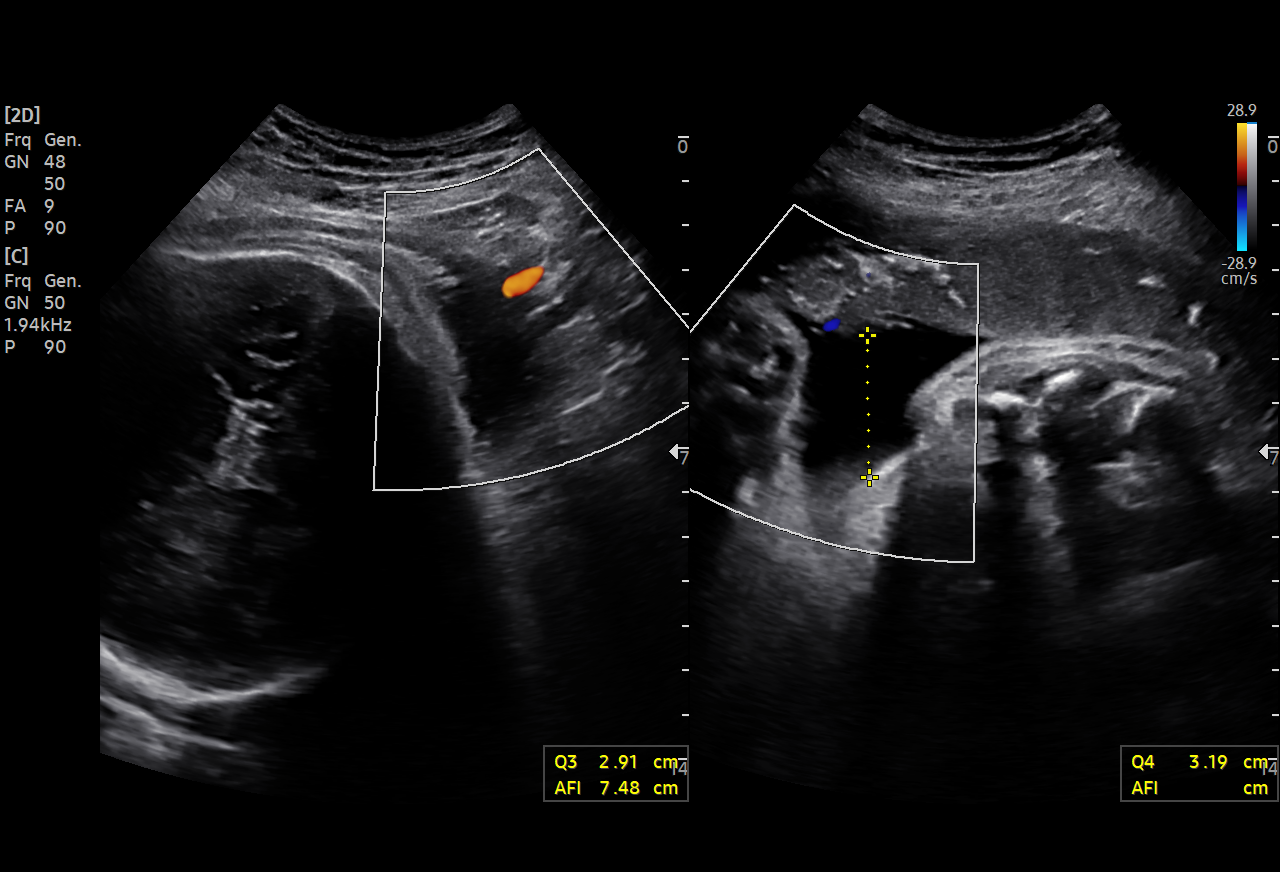
[im 3/4]
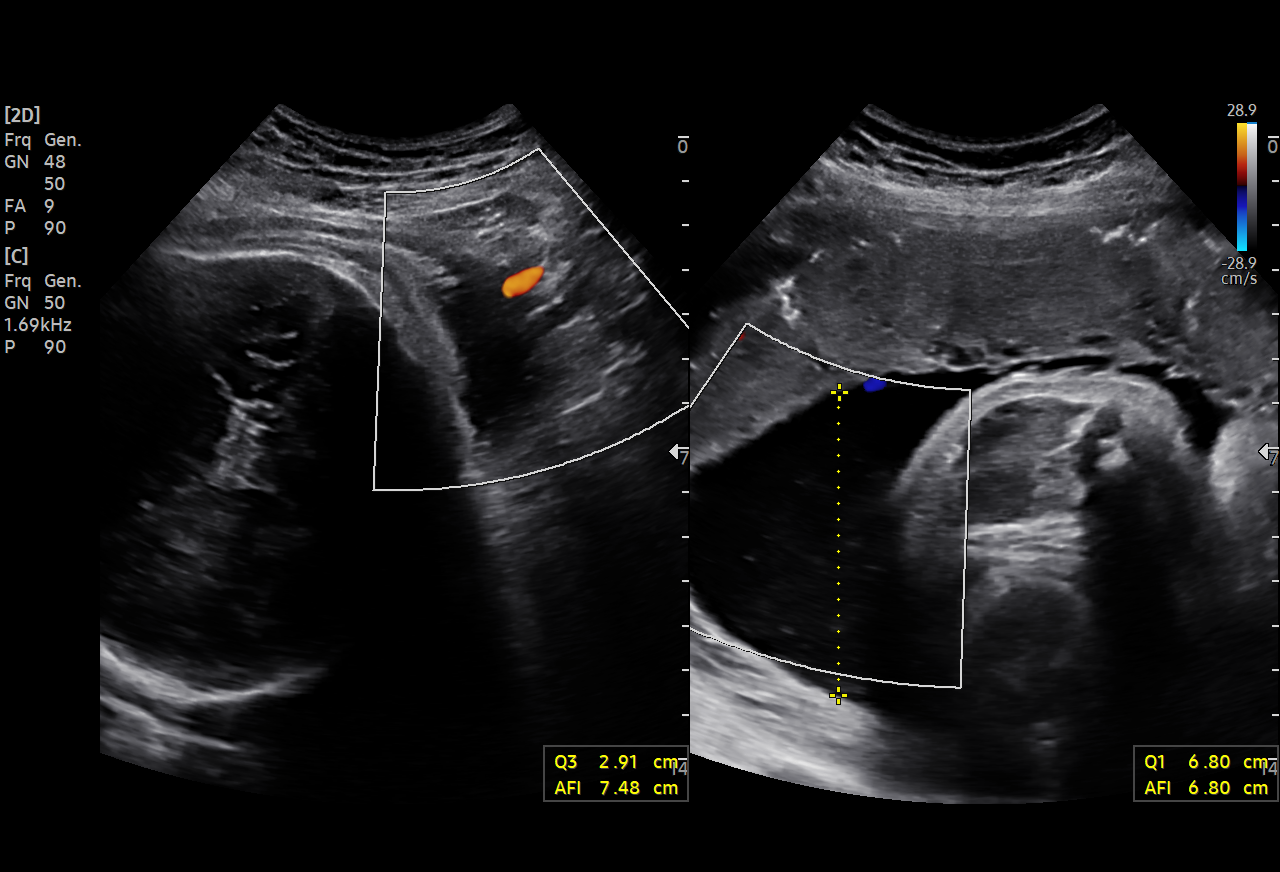
[im 4/4]
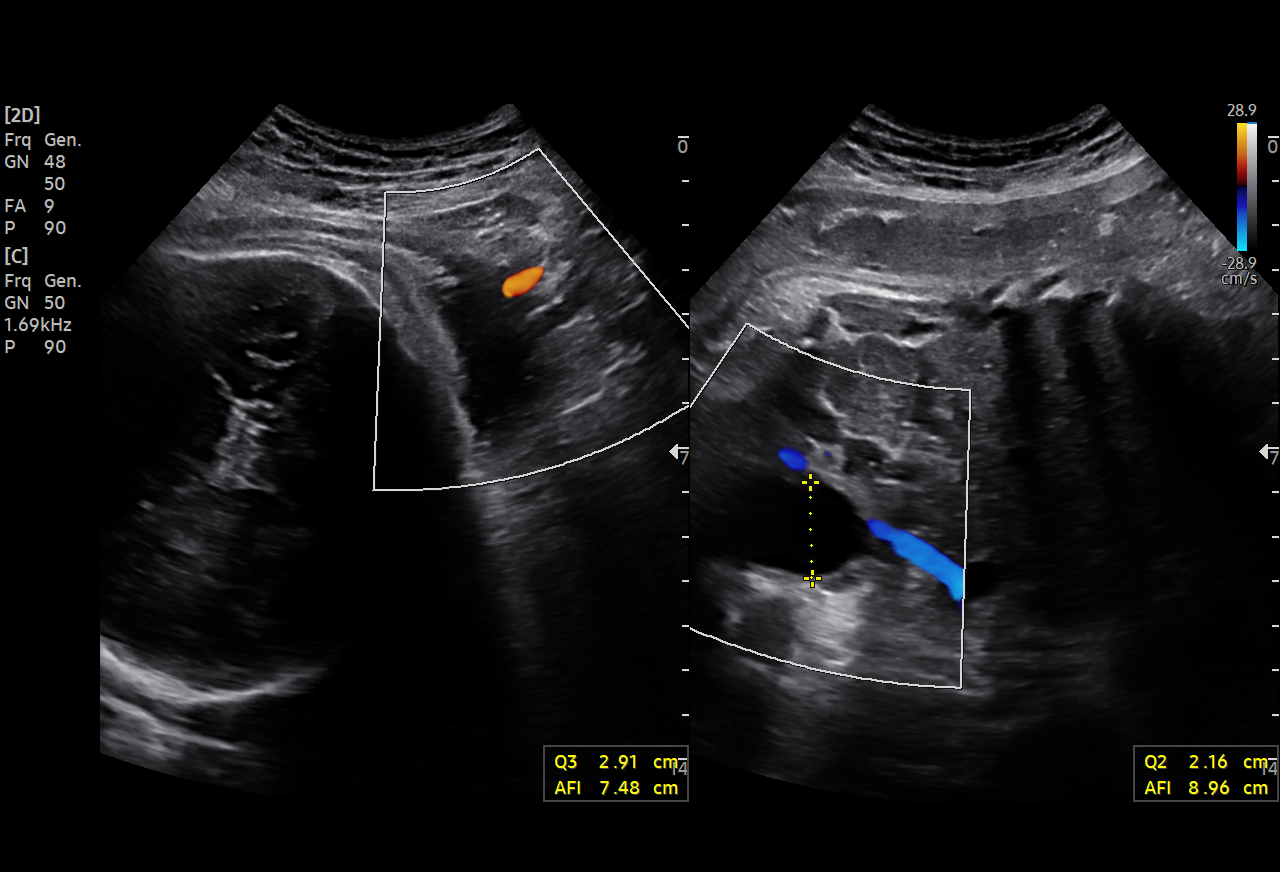

[4 of 4 positions shown; findings below may reference images not displayed]

[REDACTED]

 1   [HOSPITAL]                        768[address].0      NATHALY DELACRUZ

Indications

 40 weeks gestation of pregnancy
 Postdate pregnancy (40-42 weeks)
Fetal Evaluation

 Num Of Fetuses:          1
 Fetal Heart Rate(bpm):   134
 Cardiac Activity:        Observed

 AFI Sum(cm)     %Tile       Largest Pocket(cm)
 12.87           53

 RUQ(cm)       RLQ(cm)        LUQ(cm)        LLQ(cm)

OB History

 Gravidity:     1
Gestational Age

 LMP:            40w 1d       Date:  02/12/19                   EDD:  11/19/19
 Best:           40w 1d    Det. By:  LMP  (02/12/19)            EDD:  11/19/19
Comments

 A limited ultrasound performed today shows that the fetus is in
 the vertex presentation.
 There was normal amniotic fluid noted.
Impression

 Viable fetus in vertex presentation
                 Beyer, Rachael
